# Patient Record
Sex: Male | Born: 1960 | Race: Black or African American | Hispanic: No | Marital: Married | Smoking: Former smoker
Health system: Southern US, Community
[De-identification: ages and names within clinical notes are randomized; demographics above are authoritative.]

## PROBLEM LIST (undated history)

## (undated) ENCOUNTER — Emergency Department (HOSPITAL_COMMUNITY): Admission: EM | Payer: Self-pay | Source: Home / Self Care

## (undated) DIAGNOSIS — G8929 Other chronic pain: Secondary | ICD-10-CM

## (undated) DIAGNOSIS — E1136 Type 2 diabetes mellitus with diabetic cataract: Secondary | ICD-10-CM

## (undated) DIAGNOSIS — M549 Dorsalgia, unspecified: Secondary | ICD-10-CM

## (undated) DIAGNOSIS — N529 Male erectile dysfunction, unspecified: Secondary | ICD-10-CM

## (undated) DIAGNOSIS — E785 Hyperlipidemia, unspecified: Secondary | ICD-10-CM

## (undated) DIAGNOSIS — Z1211 Encounter for screening for malignant neoplasm of colon: Secondary | ICD-10-CM

## (undated) DIAGNOSIS — R918 Other nonspecific abnormal finding of lung field: Secondary | ICD-10-CM

## (undated) HISTORY — DX: Male erectile dysfunction, unspecified: N52.9

## (undated) HISTORY — DX: Hyperlipidemia, unspecified: E78.5

## (undated) HISTORY — DX: Type 2 diabetes mellitus with diabetic cataract: E11.36

## (undated) HISTORY — DX: Other chronic pain: G89.29

## (undated) HISTORY — DX: Dorsalgia, unspecified: M54.9

---

## 1999-03-06 DIAGNOSIS — E118 Type 2 diabetes mellitus with unspecified complications: Secondary | ICD-10-CM | POA: Insufficient documentation

## 2000-02-29 ENCOUNTER — Emergency Department (HOSPITAL_COMMUNITY): Admission: EM | Admit: 2000-02-29 | Discharge: 2000-02-29 | Payer: Self-pay | Admitting: Emergency Medicine

## 2005-03-05 HISTORY — PX: CATARACT EXTRACTION: SUR2

## 2008-04-12 ENCOUNTER — Encounter: Payer: Self-pay | Admitting: Internal Medicine

## 2008-09-20 ENCOUNTER — Encounter: Payer: Self-pay | Admitting: Internal Medicine

## 2008-09-27 ENCOUNTER — Ambulatory Visit: Payer: Self-pay | Admitting: Internal Medicine

## 2008-09-27 ENCOUNTER — Encounter: Payer: Self-pay | Admitting: Internal Medicine

## 2008-09-27 DIAGNOSIS — F528 Other sexual dysfunction not due to a substance or known physiological condition: Secondary | ICD-10-CM | POA: Insufficient documentation

## 2008-09-27 DIAGNOSIS — M549 Dorsalgia, unspecified: Secondary | ICD-10-CM | POA: Insufficient documentation

## 2008-09-27 DIAGNOSIS — E1136 Type 2 diabetes mellitus with diabetic cataract: Secondary | ICD-10-CM

## 2008-09-27 DIAGNOSIS — E785 Hyperlipidemia, unspecified: Secondary | ICD-10-CM

## 2008-09-27 LAB — CONVERTED CEMR LAB
Blood Glucose, Fingerstick: 174
Microalb Creat Ratio: 3.5 mg/g (ref 0.0–30.0)

## 2008-10-11 ENCOUNTER — Encounter (INDEPENDENT_AMBULATORY_CARE_PROVIDER_SITE_OTHER): Payer: Self-pay | Admitting: *Deleted

## 2008-10-11 ENCOUNTER — Ambulatory Visit: Payer: Self-pay | Admitting: Internal Medicine

## 2008-10-11 ENCOUNTER — Encounter: Payer: Self-pay | Admitting: Internal Medicine

## 2008-10-11 LAB — CONVERTED CEMR LAB
AST: 29 units/L (ref 0–37)
Alkaline Phosphatase: 79 units/L (ref 39–117)
Blood Glucose, Fingerstick: 147
Indirect Bilirubin: 0.6 mg/dL (ref 0.0–0.9)
LDL Cholesterol: 193 mg/dL — ABNORMAL HIGH (ref 0–99)
Total Protein: 7.8 g/dL (ref 6.0–8.3)
Triglycerides: 131 mg/dL (ref <150)

## 2008-10-14 ENCOUNTER — Encounter: Payer: Self-pay | Admitting: Internal Medicine

## 2008-10-25 ENCOUNTER — Ambulatory Visit: Payer: Self-pay | Admitting: Internal Medicine

## 2009-01-03 ENCOUNTER — Telehealth: Payer: Self-pay | Admitting: *Deleted

## 2009-01-06 ENCOUNTER — Ambulatory Visit: Payer: Self-pay | Admitting: Infectious Disease

## 2009-01-06 ENCOUNTER — Encounter: Payer: Self-pay | Admitting: Internal Medicine

## 2009-01-06 LAB — CONVERTED CEMR LAB
Blood Glucose, Fingerstick: 114
Hemoglobin, Urine: NEGATIVE
Leukocytes, UA: NEGATIVE
Microalb Creat Ratio: 4 mg/g (ref 0.0–30.0)
Microalb, Ur: 0.5 mg/dL (ref 0.00–1.89)
Nitrite: NEGATIVE
Protein, ur: NEGATIVE mg/dL
pH: 6.5 (ref 5.0–8.0)

## 2009-01-20 ENCOUNTER — Ambulatory Visit: Payer: Self-pay | Admitting: Internal Medicine

## 2009-01-20 LAB — CONVERTED CEMR LAB
AST: 37 units/L (ref 0–37)
Alkaline Phosphatase: 75 units/L (ref 39–117)
Bilirubin, Direct: 0.2 mg/dL (ref 0.0–0.3)
Total Bilirubin: 0.8 mg/dL (ref 0.3–1.2)

## 2009-03-09 ENCOUNTER — Telehealth: Payer: Self-pay | Admitting: Internal Medicine

## 2009-03-30 ENCOUNTER — Encounter: Payer: Self-pay | Admitting: Internal Medicine

## 2009-03-30 LAB — HM DIABETES EYE EXAM

## 2009-04-13 ENCOUNTER — Telehealth: Payer: Self-pay | Admitting: Internal Medicine

## 2009-05-17 ENCOUNTER — Telehealth: Payer: Self-pay | Admitting: *Deleted

## 2009-05-24 ENCOUNTER — Telehealth: Payer: Self-pay | Admitting: *Deleted

## 2009-06-28 ENCOUNTER — Telehealth: Payer: Self-pay | Admitting: Internal Medicine

## 2009-08-02 ENCOUNTER — Telehealth: Payer: Self-pay | Admitting: *Deleted

## 2009-08-29 ENCOUNTER — Telehealth (INDEPENDENT_AMBULATORY_CARE_PROVIDER_SITE_OTHER): Payer: Self-pay | Admitting: *Deleted

## 2009-09-06 ENCOUNTER — Ambulatory Visit: Payer: Self-pay | Admitting: Internal Medicine

## 2009-09-14 ENCOUNTER — Encounter: Payer: Self-pay | Admitting: Internal Medicine

## 2009-10-24 ENCOUNTER — Telehealth: Payer: Self-pay | Admitting: Internal Medicine

## 2009-11-16 ENCOUNTER — Telehealth: Payer: Self-pay | Admitting: Internal Medicine

## 2010-01-24 ENCOUNTER — Telehealth: Payer: Self-pay | Admitting: Internal Medicine

## 2010-02-21 ENCOUNTER — Telehealth: Payer: Self-pay | Admitting: Internal Medicine

## 2010-03-24 ENCOUNTER — Telehealth: Payer: Self-pay | Admitting: *Deleted

## 2010-04-04 NOTE — Assessment & Plan Note (Signed)
Summary: acute-resch from 09/02/2009/cfb   Vital Signs:  Patient profile:   50 year old [place] Height:      66.5 inches (168.91 cm) Weight:      215.6 pounds (98.00 kg) BMI:     34.40 Temp:     97.1 degrees F (36.17 degrees C) oral Pulse rate:   87 / minute BP sitting:   110 / 76  (right arm)  Vitals Entered By: Stanton Kidney Ditzler RN (September 06, 2009 3:52 PM) Is Patient Diabetic? Yes Did you bring your meter with you today? No Pain Assessment Patient in pain? no      Nutritional Status BMI of > 30 = obese Nutritional Status Detail appetite good CBG Result 104  Have you ever been in a relationship where you felt threatened, hurt or afraid?denies   Does patient need assistance? Functional Status Self care Ambulation Normal Comments Refill pain med for back.   History of Present Illness: 50 YO AA [place] recently released from prison (incarcerated from 09/ 2003 to 08/2008) with a PMH of DM HLP, back pain. and is here for F/U. Patient is doing well, and denies any complaints..   His longterm condtions are:  1. DM, type 2 - diagnosed in 2005. On Metformin and Glipizide..  Diet: irregular; high in carbs and fats. Exercises; 2 miles of running/day x 5 days/week. Last Eye exam: 03/2009. OU are with cataracts. OS cataract surgery in 2007. C/O a blurry vision.   2. On last visit Lower back pain, right-sided. Onset 1 year ago. No trauma recalled. "Just one day woke up with it." Described as sharp, intermittent pain, 8/10; worse in morning; notices stifness that gradulally resolves with ROM.Pain is worse with standing, running and in a supine positions and better with sitting down. No radiculopathy. No bladder or bowel incontinence; no fever or chills. we started tramadol 50mg  three times a day. patient today has no pain and would like a refill.   3. Hyperlipidemia, New Diagnosis. tolerates pravastatin well, will recheck FLP and LFT on next visit as patient had a large meal today prior to this  office visit.    Depression History:      The patient denies a depressed mood most of the day and a diminished interest in his usual daily activities.         Preventive Screening-Counseling & Management  Alcohol-Tobacco     Alcohol drinks/day: 2     Alcohol type: beer     Alcohol Counseling: not indicated; use of alcohol is not excessive or problematic     Smoking Status: quit     Packs/Day: 0.25     Year Started: 1998     Year Quit: March 07, 2001  Caffeine-Diet-Exercise     Caffeine use/day: none     Does Patient Exercise: yes     Type of exercise: run, walk     Times/week: every day  Allergies: No Known Drug Allergies  Review of Systems       Per HPI  Physical Exam  General:  alert, well-developed, and cooperative to examination.    Lungs:  normal respiratory effort, no accessory muscle use, normal breath sounds, no crackles, and no wheezes.  Heart:  normal rate, regular rhythm, no murmur, no gallop, and no rub.    Abdomen:  soft, non-tender, normal bowel sounds, no distention, no guarding, no rebound tenderness, no hepatomegaly, and no splenomegaly.    Msk:  no joint swelling, no joint warmth, and no redness  over joints.    Pulses:  2+ DP/PT pulses bilaterally  Extremities:  No cyanosis, clubbing, edema  Neurologic:  alert & oriented X3, cranial nerves II-XII intact, strength normal in all extremities, sensation intact to light touch, and gait normal.       Impression & Recommendations:  Problem # 1:  DM (ICD-250.00) a1c 6.7 today, well controlled, will not make any changes today, patient is uptodate of his vaccinations and eye exams..  will bring back in 3 months for recheck.   His updated medication list for this problem includes:    Metformin Hcl 500 Mg Tabs (Metformin hcl) .Marland Kitchen... Take 1 tablet by mouth once a day    Glipizide Xl 5 Mg Xr24h-tab (Glipizide) .Marland Kitchen... Take 1 tablet by mouth once a day    Aspirin 81 Mg Chew (Aspirin) .Marland Kitchen... Take 1 tablet by mouth  once a day with meals  Orders: T- Capillary Blood Glucose (82948) T-Hgb A1C (in-house) (04540JW)  Problem # 2:  HYPERLIPIDEMIA (ICD-272.4) LDL <100 per last check, on next f/u will check FLP and LFTs. for now will continue pravastatin as patient is tolerating it well.   His updated medication list for this problem includes:    Pravastatin Sodium 40 Mg Tabs (Pravastatin sodium) .Marland Kitchen... Take on tablet at evening.  Labs Reviewed: SGOT: 37 (01/20/2009)   SGPT: 55 (01/20/2009)   HDL:54 (01/20/2009), 58 (10/11/2008)  LDL:98 (01/20/2009), 193 (11/91/4782)  Chol:171 (01/20/2009), 277 (10/11/2008)  Trig:93 (01/20/2009), 131 (10/11/2008)  Problem # 3:  BACK PAIN, CHRONIC (ICD-724.5) well controlled with tramadol, will continue current regiment.   His updated medication list for this problem includes:    Aspirin 81 Mg Chew (Aspirin) .Marland Kitchen... Take 1 tablet by mouth once a day with meals    Ibuprofen 200 Mg Tabs (Ibuprofen) ..... Marland Kitchentake one or two tablets every 6 hours as needed for pain    Tramadol Hcl 50 Mg Tabs (Tramadol hcl) .Marland Kitchen... Take 1-2 tablets, two times per day.  Complete Medication List: 1)  Metformin Hcl 500 Mg Tabs (Metformin hcl) .... Take 1 tablet by mouth once a day 2)  Glipizide Xl 5 Mg Xr24h-tab (Glipizide) .... Take 1 tablet by mouth once a day 3)  Aspirin 81 Mg Chew (Aspirin) .... Take 1 tablet by mouth once a day with meals 4)  Fish Oil 1000 Mg Caps (Omega-3 fatty acids) .... Take 1 capsule by mouth two times a day 5)  Ibuprofen 200 Mg Tabs (Ibuprofen) .... .take one or two tablets every 6 hours as needed for pain 6)  Viagra 100 Mg Tabs (Sildenafil citrate) .... Take one tablet 45 minutes before need. do not take more that one tablet per day. 7)  Tramadol Hcl 50 Mg Tabs (Tramadol hcl) .... Take 1-2 tablets, two times per day. 8)  Freestyle Lite Test Strp (Glucose blood) .... Test blood sugar daily 9)  Lancets Misc (Lancets) .... Use to test blood sugar daily 10)  Pravastatin Sodium  40 Mg Tabs (Pravastatin sodium) .... Take on tablet at evening.  Patient Instructions: 1)  Please schedule a follow-up appointment in 3 months for check of A1c, FLP and LFT's Prescriptions: TRAMADOL HCL 50 MG TABS (TRAMADOL HCL) take 1-2 tablets, two times per day.  #120 x 0   Entered and Authorized by:   Darnelle Maffucci MD   Signed by:   Darnelle Maffucci MD on 09/06/2009   Method used:   Print then Give to Patient   RxID:   [id number]  Prevention & Chronic Care Immunizations   Influenza vaccine: Fluvax 3+  (01/06/2009)   Influenza vaccine deferral: Deferred  (09/06/2009)    Tetanus booster: Not documented   Td booster deferral: Deferred  (01/06/2009)    Pneumococcal vaccine: Not documented  Other Screening   PSA: 2.47  (10/11/2008)   PSA action/deferral: Discussed-PSA requested  (10/11/2008)   Smoking status: quit  (09/06/2009)  Diabetes Mellitus   HgbA1C: 6.7  (09/06/2009)    Eye exam: No diabetic retinopathy.   Cataract.  OD   (03/30/2009)   Diabetic eye exam action/deferral: Ophthalmology referral  (10/11/2008)   Eye exam due: 04/2010    Foot exam: yes  (09/27/2008)   High risk foot: Not documented   Foot care education: Not documented    Urine microalbumin/creatinine ratio: 4.0  (01/06/2009)    Diabetes flowsheet reviewed?: Yes   Progress toward A1C goal: At goal  Lipids   Total Cholesterol: 171  (01/20/2009)   Lipid panel action/deferral: Lipid Panel ordered   LDL: 98  (01/20/2009)   LDL Direct: Not documented   HDL: 54  (01/20/2009)   Triglycerides: 93  (01/20/2009)    SGOT (AST): 37  (01/20/2009)   BMP action: Ordered   SGPT (ALT): 55  (01/20/2009)   Alkaline phosphatase: 75  (01/20/2009)   Total bilirubin: 0.8  (01/20/2009)    Lipid flowsheet reviewed?: Yes   Progress toward LDL goal: At goal  Self-Management Support :   Personal Goals (by the next clinic visit) :     Personal A1C goal: 7  (01/06/2009)     Personal blood pressure goal:  130/80  (01/06/2009)     Personal LDL goal: 100  (09/06/2009)    Patient will work on the following items until the next clinic visit to reach self-care goals:     Medications and monitoring: take my medicines every day, check my blood sugar, bring all of my medications to every visit, examine my feet every day  (09/06/2009)     Eating: drink diet soda or water instead of juice or soda, eat more vegetables, use fresh or frozen vegetables, eat fruit for snacks and desserts  (09/06/2009)     Activity: take a 30 minute walk every day, take the stairs instead of the elevator  (09/06/2009)    Diabetes self-management support: Written self-care plan, Education handout, Resources for patients handout  (09/06/2009)   Diabetes care plan printed   Diabetes education handout printed   Last diabetes self-management training by diabetes educator: 10/25/2008    Lipid self-management support: Written self-care plan, Education handout, Resources for patients handout  (09/06/2009)   Lipid self-care plan printed.   Lipid education handout printed      Resource handout printed.  Laboratory Results   Blood Tests   Date/Time Received: September 06, 2009 4:05 PM  Date/Time Reported: Alric Quan  September 06, 2009 4:05 PM   HGBA1C: 6.7%   (Normal Range: Non-Diabetic - 3-6%   Control Diabetic - 6-8%) CBG Random:: 104mg /dL

## 2010-04-04 NOTE — Progress Notes (Signed)
Summary: refill/ hla  Phone Note Refill Request Message from:  Pharmacy on May 24, 2009 4:53 PM  Refills Requested: Medication #1:  METFORMIN HCL 500 MG TABS Take 1 tablet by mouth once a day   Last Refilled: 04/13/2009 Initial call taken by: Marin Roberts RN,  May 24, 2009 4:53 PM  Follow-up for Phone Call        By accident I printed script then I threw it away, and resent again via fax. Follow-up by: Darnelle Maffucci MD,  May 24, 2009 8:11 PM  Additional Follow-up for Phone Call Additional follow up Details #1::        Rx called to pharmacy Additional Follow-up by: Marin Roberts RN,  May 31, 2009 4:55 PM    Prescriptions: METFORMIN HCL 500 MG TABS (METFORMIN HCL) Take 1 tablet by mouth once a day  #30 x 5   Entered and Authorized by:   Darnelle Maffucci MD   Signed by:   Darnelle Maffucci MD on 05/24/2009   Method used:   Faxed to ...       [facility] Department (retail)       [address] [place], [place]  [address]       Ph: [phone]       Fax: [phone]   RxID:   [id number] METFORMIN HCL 500 MG TABS (METFORMIN HCL) Take 1 tablet by mouth once a day  #30 x 5   Entered and Authorized by:   Darnelle Maffucci MD   Signed by:   Darnelle Maffucci MD on 05/24/2009   Method used:   Print then Give to Patient   RxID:   [id number]

## 2010-04-04 NOTE — Progress Notes (Signed)
Summary: refill/gg    Phone Note Refill Request  on August 29, 2009 3:47 PM  Refills Requested: Medication #1:  TRAMADOL HCL 50 MG TABS take 1-2 tablets   Last Refilled: 08/03/2009  Method Requested: Electronic Initial call taken by: Merrie Roof RN,  August 29, 2009 3:48 PM  Follow-up for Phone Call        Pt. needs an OV. He has not been seen in 11 months and has been getting 120 of these a month for the last 6-7 months. He should not need any of these until 09/02/09. Follow-up by: Zoila Shutter MD,  August 29, 2009 4:06 PM  Additional Follow-up for Phone Call Additional follow up Details #1::        Pt has appointment July 1 refills at that time Additional Follow-up by: Merrie Roof RN,  September 02, 2009 1:47 PM

## 2010-04-04 NOTE — Progress Notes (Signed)
Summary: refill/ hla  Phone Note Refill Request Message from:  Fax from Pharmacy on May 17, 2009 2:44 PM  Refills Requested: Medication #1:  TRAMADOL HCL 50 MG TABS take 1-2 tablets   Last Refilled: 2/9 last visit 11/18, never had cmet  Initial call taken by: Marin Roberts RN,  May 17, 2009 2:46 PM  Follow-up for Phone Call       Follow-up by: Darnelle Maffucci MD,  May 17, 2009 8:19 PM    Prescriptions: TRAMADOL HCL 50 MG TABS (TRAMADOL HCL) take 1-2 tablets, two times per day.  #120 x 0   Entered and Authorized by:   Darnelle Maffucci MD   Signed by:   Darnelle Maffucci MD on 05/17/2009   Method used:   Electronically to        [facility]  [address] [id number]* (retail)       [address]       [place], [place]  [address]       Ph: [phone] or [phone]       Fax: [phone]   RxID:   [id number]

## 2010-04-04 NOTE — Progress Notes (Signed)
Summary: REFILL/ HLA  Phone Note Refill Request Message from:  Fax from Pharmacy on April 13, 2009 12:11 PM  Refills Requested: Medication #1:  TRAMADOL HCL 50 MG TABS take 1-2 tablets   Dosage confirmed as above?Dosage Confirmed   Last Refilled: 1/5 Initial call taken by: Marin Roberts RN,  April 13, 2009 12:11 PM    Prescriptions: TRAMADOL HCL 50 MG TABS (TRAMADOL HCL) take 1-2 tablets, two times per day.  #120 x 0   Entered and Authorized by:   Darnelle Maffucci MD   Signed by:   Darnelle Maffucci MD on 04/13/2009   Method used:   Faxed to ...       [facility] Department (retail)       [address] [place], [place]  [address]       Ph: [phone]       Fax: [phone]   RxID:   [id number]

## 2010-04-04 NOTE — Progress Notes (Signed)
Summary: Refill/gh  Phone Note Refill Request Message from:  Fax from Pharmacy on Aug 02, 2009 4:05 PM  Refills Requested: Medication #1:  TRAMADOL HCL 50 MG TABS take 1-2 tablets   Last Refilled: 06/30/2009  Method Requested: Fax to Local Pharmacy Initial call taken by: Angelina Ok RN,  Aug 02, 2009 4:06 PM  Follow-up for Phone Call       Follow-up by: Darnelle Maffucci MD,  Aug 02, 2009 8:38 PM  Additional Follow-up for Phone Call Additional follow up Details #1::        Rx called to pharmacy Additional Follow-up by: Marin Roberts RN,  August 03, 2009 2:04 PM    Prescriptions: TRAMADOL HCL 50 MG TABS (TRAMADOL HCL) take 1-2 tablets, two times per day.  #120 x 0   Entered and Authorized by:   Darnelle Maffucci MD   Signed by:   Darnelle Maffucci MD on 08/02/2009   Method used:   Printed then faxed to ...       [facility] Department (retail)       [address] [place], [place]  [address]       Ph: [phone]       Fax: [phone]   RxID:   [id number]

## 2010-04-04 NOTE — Progress Notes (Signed)
Summary: Refill/gh  Phone Note Refill Request Message from:  Fax from Pharmacy on March 09, 2009 10:16 AM  Refills Requested: Medication #1:  TRAMADOL HCL 50 MG TABS take 1-2 tablets   Last Refilled: 02/08/2009  Method Requested: Fax to Local Pharmacy Initial call taken by: Angelina Ok RN,  March 09, 2009 10:17 AM  Follow-up for Phone Call        Rx written and printed off.  Tramadol for LBP, max of three per day per old records but has been receiving 120 per refill.  Will refill for one month at #120.   Follow-up by: Blanch Media MD,  March 09, 2009 10:47 AM    Prescriptions: TRAMADOL HCL 50 MG TABS (TRAMADOL HCL) take 1-2 tablets, two times per day.  #120 x 0   Entered and Authorized by:   Blanch Media MD   Signed by:   Blanch Media MD on 03/09/2009   Method used:   Print then Give to Patient   RxID:   [id number]   Appended Document: Refill/gh Prescription refill for Tramadol called to the [facility].  Lynden Oxford, RN March 09, 2009 11:10 AM

## 2010-04-04 NOTE — Progress Notes (Signed)
Summary: refill/ hla  Phone Note Refill Request Message from:  Fax from Pharmacy on January 24, 2010 3:54 PM  Refills Requested: Medication #1:  TRAMADOL HCL 50 MG TABS take 1-2 tablets   Dosage confirmed as above?Dosage Confirmed   Last Refilled: 9/15 Initial call taken by: Marin Roberts RN,  January 24, 2010 3:54 PM  Follow-up for Phone Call       Follow-up by: Darnelle Maffucci MD,  January 25, 2010 4:59 PM    Prescriptions: TRAMADOL HCL 50 MG TABS (TRAMADOL HCL) take 1-2 tablets, two times per day.  #120 x 0   Entered and Authorized by:   Darnelle Maffucci MD   Signed by:   Darnelle Maffucci MD on 01/25/2010   Method used:   Faxed to ...       [facility] (retail)             [place], [place]         Ph:        Fax: [phone]   RxID:   [id number] TRAMADOL HCL 50 MG TABS (TRAMADOL HCL) take 1-2 tablets, two times per day.  #120 x 0   Entered and Authorized by:   Darnelle Maffucci MD   Signed by:   Darnelle Maffucci MD on 01/25/2010   Method used:   Print then Give to Patient   RxID:   [id number]

## 2010-04-04 NOTE — Progress Notes (Signed)
Summary: Refill/gh  Phone Note Refill Request Message from:  Fax from Pharmacy on October 24, 2009 1:50 PM  Refills Requested: Medication #1:  TRAMADOL HCL 50 MG TABS take 1-2 tablets   Last Refilled: 09/14/2009  Method Requested: Electronic Initial call taken by: Angelina Ok RN,  October 24, 2009 1:50 PM  Follow-up for Phone Call        script was faxed. thanks. Follow-up by: Darnelle Maffucci MD,  October 24, 2009 2:23 PM  Additional Follow-up for Phone Call Additional follow up Details #1::        Rx faxed to pharmacy Additional Follow-up by: Angelina Ok RN,  October 25, 2009 3:37 PM    Prescriptions: TRAMADOL HCL 50 MG TABS (TRAMADOL HCL) take 1-2 tablets, two times per day.  #120 x 0   Entered and Authorized by:   Darnelle Maffucci MD   Signed by:   Darnelle Maffucci MD on 10/24/2009   Method used:   Faxed to ...       [facility] Department (retail)       [address] [place], [place]  [address]       Ph: [phone]       Fax: [phone]   RxID:   [id number]

## 2010-04-04 NOTE — Progress Notes (Signed)
Summary: refill/gg  Phone Note Refill Request  on June 28, 2009 10:06 AM  Refills Requested: Medication #1:  GLIPIZIDE XL 5 MG XR24H-TAB Take 1 tablet by mouth once a day   Last Refilled: 05/27/2009  Medication #2:  TRAMADOL HCL 50 MG TABS take 1-2 tablets   Last Refilled: 05/27/2009  Method Requested: Fax to Local Pharmacy Initial call taken by: Merrie Roof RN,  June 28, 2009 10:06 AM  Follow-up for Phone Call        Refill approved-nurse to complete Follow-up by: Darnelle Maffucci MD,  June 29, 2009 9:43 PM  Additional Follow-up for Phone Call Additional follow up Details #1::        Rx faxed to pharmacy Additional Follow-up by: Merrie Roof RN,  June 30, 2009 8:57 AM    Prescriptions: TRAMADOL HCL 50 MG TABS (TRAMADOL HCL) take 1-2 tablets, two times per day.  #120 x 0   Entered and Authorized by:   Darnelle Maffucci MD   Signed by:   Darnelle Maffucci MD on 06/29/2009   Method used:   Telephoned to ...       [facility] Department (retail)       [address] [place], [place]  [address]       Ph: [phone]       Fax: [phone]   RxID:   [id number] GLIPIZIDE XL 5 MG XR24H-TAB (GLIPIZIDE) Take 1 tablet by mouth once a day  #30 x 5   Entered and Authorized by:   Darnelle Maffucci MD   Signed by:   Darnelle Maffucci MD on 06/29/2009   Method used:   Telephoned to ...       [facility] Department (retail)       [address] [place], [place]  [address]       Ph: [phone]       Fax: [phone]   RxID:   [id number]

## 2010-04-04 NOTE — Progress Notes (Signed)
Summary: med refill/gp  Phone Note Refill Request Message from:  Fax from Pharmacy on November 16, 2009 3:25 PM  Refills Requested: Medication #1:  TRAMADOL HCL 50 MG TABS take 1-2 tablets   Last Refilled: 10/24/2009 Last appt. 09/06/09.   Method Requested: Telephone to Pharmacy Initial call taken by: Chinita Pester RN,  November 16, 2009 3:25 PM  Follow-up for Phone Call        Rx refill request faxed to [facility] pharmacy. Follow-up by: Chinita Pester RN,  November 18, 2009 10:00 AM    Prescriptions: TRAMADOL HCL 50 MG TABS (TRAMADOL HCL) take 1-2 tablets, two times per day.  #120 x 0   Entered and Authorized by:   Darnelle Maffucci MD   Signed by:   Darnelle Maffucci MD on 11/16/2009   Method used:   Faxed to ...       [facility] Department (retail)       [address] [place], [place]  [address]       Ph: [phone]       Fax: [phone]   RxID:   [id number]

## 2010-04-04 NOTE — Consult Note (Signed)
Summary: [employer] 2010: Diabetic Eye Exam  [employer] 2010: Diabetic Eye Exam   Imported By: Florinda Marker 05/25/2009 16:50:03  _____________________________________________________________________  External Attachment:    Type:   Image     Comment:   External Document  Appended Document: [employer] 2010: Diabetic Eye Exam   Diabetic Eye Exam  Procedure date:  03/30/2009  Findings:      No diabetic retinopathy.   Cataract.  OD   Procedures Next Due Date:    Diabetic Eye Exam: 04/2010   Diabetic Eye Exam  Procedure date:  03/30/2009  Findings:      No diabetic retinopathy.   Cataract.  OD   Procedures Next Due Date:    Diabetic Eye Exam: 04/2010

## 2010-04-04 NOTE — Medication Information (Signed)
Summary: Medication Assistance: RX  Medication Assistance: RX   Imported By: Shon Hough 09/23/2009 15:04:55  _____________________________________________________________________  External Attachment:    Type:   Image     Comment:   External Document

## 2010-04-06 NOTE — Progress Notes (Signed)
Summary: refill/ hla  Phone Note Refill Request Message from:  Fax from Pharmacy on March 24, 2010 6:24 PM  Refills Requested: Medication #1:  TRAMADOL HCL 50 MG TABS take 1-2 tablets   Dosage confirmed as above?Dosage Confirmed   Last Refilled: 12/23 Initial call taken by: Marin Roberts RN,  March 24, 2010 6:24 PM  Additional Follow-up for Phone Call Additional follow up Details #1::        Rx called to pharmacy Additional Follow-up by: Marin Roberts RN,  March 30, 2010 3:02 PM    Prescriptions: TRAMADOL HCL 50 MG TABS (TRAMADOL HCL) take 1-2 tablets, two times per day.  #120 x 0   Entered and Authorized by:   Darnelle Maffucci MD   Signed by:   Darnelle Maffucci MD on 03/25/2010   Method used:   Faxed to ...       [facility] Department (retail)       [address] [place], [place]  [address]       Ph: [phone]       Fax: [phone]   RxID:   [id number]

## 2010-04-06 NOTE — Progress Notes (Signed)
Summary: med refill/gp  Phone Note Refill Request Message from:  Fax from Pharmacy on February 21, 2010 10:24 AM  Refills Requested: Medication #1:  TRAMADOL HCL 50 MG TABS take 1-2 tablets   Last Refilled: 01/30/2010 Last appt, 09/06/09; next appt. 04/10/10.   Method Requested: Fax to Local Pharmacy Initial call taken by: Chinita Pester RN,  February 21, 2010 10:24 AM  Follow-up for Phone Call        Rx faxed to pharmacy. Follow-up by: Margarito Liner MD,  February 21, 2010 10:45 AM    Prescriptions: TRAMADOL HCL 50 MG TABS (TRAMADOL HCL) take 1-2 tablets, two times per day.  #120 x 0   Entered and Authorized by:   Margarito Liner MD   Signed by:   Margarito Liner MD on 02/21/2010   Method used:   Faxed to ...       [facility] Department (retail)       [address] [place], [place]  [address]       Ph: [phone]       Fax: [phone]   RxID:   [id number]   Appended Document: med refill/gp Tramadol Rx refill request faxed to [facility] pharmacy.

## 2010-04-10 ENCOUNTER — Encounter: Payer: Self-pay | Admitting: Internal Medicine

## 2010-04-18 ENCOUNTER — Other Ambulatory Visit: Payer: Self-pay | Admitting: *Deleted

## 2010-04-20 MED ORDER — SILDENAFIL CITRATE 100 MG PO TABS: 100.0000 mg | ORAL_TABLET | ORAL | Status: DC | PRN

## 2010-04-28 ENCOUNTER — Other Ambulatory Visit: Payer: Self-pay | Admitting: *Deleted

## 2010-04-30 MED ORDER — TRAMADOL HCL 50 MG PO TABS: 50.0000 mg | ORAL_TABLET | Freq: Two times a day (BID) | ORAL | Status: DC

## 2010-06-01 ENCOUNTER — Encounter: Payer: Self-pay | Admitting: Internal Medicine

## 2010-06-06 ENCOUNTER — Encounter: Payer: Self-pay | Admitting: Internal Medicine

## 2010-06-10 LAB — GLUCOSE, CAPILLARY: Glucose-Capillary: 147 mg/dL — ABNORMAL HIGH (ref 70–99)

## 2010-06-11 LAB — GLUCOSE, CAPILLARY: Glucose-Capillary: 174 mg/dL — ABNORMAL HIGH (ref 70–99)

## 2010-06-27 ENCOUNTER — Ambulatory Visit (INDEPENDENT_AMBULATORY_CARE_PROVIDER_SITE_OTHER): Payer: Self-pay | Admitting: Internal Medicine

## 2010-06-27 ENCOUNTER — Encounter: Payer: Self-pay | Admitting: Internal Medicine

## 2010-06-27 VITALS — BP 114/73 | HR 60 | Temp 97.0°F | Ht 66.5 in | Wt 215.0 lb

## 2010-06-27 DIAGNOSIS — M549 Dorsalgia, unspecified: Secondary | ICD-10-CM

## 2010-06-27 DIAGNOSIS — Z0000 Encounter for general adult medical examination without abnormal findings: Secondary | ICD-10-CM

## 2010-06-27 DIAGNOSIS — E785 Hyperlipidemia, unspecified: Secondary | ICD-10-CM

## 2010-06-27 DIAGNOSIS — E119 Type 2 diabetes mellitus without complications: Secondary | ICD-10-CM

## 2010-06-27 DIAGNOSIS — Z23 Encounter for immunization: Secondary | ICD-10-CM

## 2010-06-27 LAB — COMPREHENSIVE METABOLIC PANEL
Albumin: 5 g/dL (ref 3.5–5.2)
BUN: 21 mg/dL (ref 6–23)
CO2: 25 mEq/L (ref 19–32)
Calcium: 10 mg/dL (ref 8.4–10.5)
Chloride: 103 mEq/L (ref 96–112)
Glucose, Bld: 123 mg/dL — ABNORMAL HIGH (ref 70–99)
Potassium: 4.3 mEq/L (ref 3.5–5.3)
Sodium: 138 mEq/L (ref 135–145)
Total Protein: 7.8 g/dL (ref 6.0–8.3)

## 2010-06-27 LAB — LIPID PANEL
Cholesterol: 266 mg/dL — ABNORMAL HIGH (ref 0–200)
Total CHOL/HDL Ratio: 5 Ratio

## 2010-06-27 LAB — POCT GLYCOSYLATED HEMOGLOBIN (HGB A1C): Hemoglobin A1C: 6.6

## 2010-06-27 MED ORDER — TRAMADOL HCL 50 MG PO TABS: 50.0000 mg | ORAL_TABLET | Freq: Two times a day (BID) | ORAL | Status: DC

## 2010-06-27 NOTE — Assessment & Plan Note (Signed)
Today's patient's A1c 6.6, no need to make any changes to his regiment, we'll refer her to an ophthalmologist for eye exam.

## 2010-06-27 NOTE — Progress Notes (Signed)
  Subjective:    Patient ID: Jacob Durham, male    DOB: Apr 28, 1960, 50 y.o.   MRN: [id number]  HPI  Patient is a 50 year old male with a past medical history of diabetes mellitus type 2, lower back pain and hyperlipidemia. Since the outpatient clinic for routine followup. She denies any new complaints. Reports that his back pain is excellently controlled with tramadol. He is compliant with all his medications. Today request refills. I have also discussed the patient's routine health maintenance examinations including colonoscopy, eye exam, Td, and pneumonia vaccine. he agrees to all the above.   Review of Systems  [all other systems reviewed and are negative       Objective:   Physical Exam  [nursing notereviewed. Constitutional: He is oriented to person, place, and time. He appears well-developed and well-nourished.  HENT:  Head: Normocephalic and atraumatic.  Eyes: Pupils are equal, round, and reactive to light.  Neck: Normal range of motion. No JVD present. No thyromegaly present.  Cardiovascular: Normal rate, regular rhythm and normal heart sounds.   Pulmonary/Chest: Effort normal and breath sounds normal. He has no wheezes. He has no rales.  Abdominal: Soft. Bowel sounds are normal. There is no tenderness. There is no rebound.  Musculoskeletal: Normal range of motion. He exhibits no edema.  Neurological: He is alert and oriented to person, place, and time.  Skin: Skin is warm and dry.          Assessment & Plan:

## 2010-06-27 NOTE — Assessment & Plan Note (Signed)
Well-controlled on tramadol, continue current treatment.

## 2010-06-27 NOTE — Assessment & Plan Note (Signed)
We'll recheck fasting lipid,

## 2010-06-28 ENCOUNTER — Telehealth: Payer: Self-pay | Admitting: Internal Medicine

## 2010-06-28 ENCOUNTER — Other Ambulatory Visit: Payer: Self-pay | Admitting: Internal Medicine

## 2010-06-28 DIAGNOSIS — E785 Hyperlipidemia, unspecified: Secondary | ICD-10-CM

## 2010-06-28 LAB — MICROALBUMIN / CREATININE URINE RATIO
Creatinine, Urine: 255.5 mg/dL
Microalb Creat Ratio: 2.7 mg/g (ref 0.0–30.0)
Microalb, Ur: 0.68 mg/dL (ref 0.00–1.89)

## 2010-06-28 MED ORDER — SIMVASTATIN 40 MG PO TABS: 40.0000 mg | ORAL_TABLET | Freq: Every day | ORAL | Status: AC

## 2010-06-28 NOTE — Telephone Encounter (Signed)
His LDL is elevated, will change his pravastatin to simvastatin for better control of his LDL. He is informed and voices agreement. Prescription called into the pharmacy for patient.

## 2010-07-13 ENCOUNTER — Telehealth: Payer: Self-pay | Admitting: *Deleted

## 2010-07-13 NOTE — Telephone Encounter (Signed)
CALLED PATIENT AND LEFT VOICE MESSAGE FOR MR Jacob Durham TO GIVE ME A CALL SO I CAN GET HIM SCHEDULED FOR [facility]. HE IS TO CALL ME AT [phone]

## 2010-08-28 ENCOUNTER — Emergency Department (HOSPITAL_COMMUNITY): Payer: Self-pay

## 2010-08-28 ENCOUNTER — Inpatient Hospital Stay (HOSPITAL_COMMUNITY): Payer: No Typology Code available for payment source

## 2010-08-28 ENCOUNTER — Inpatient Hospital Stay (HOSPITAL_COMMUNITY)
Admission: EM | Admit: 2010-08-28 | Discharge: 2010-09-03 | DRG: 956 | Disposition: A | Discharge status: Home / Self Care | Payer: No Typology Code available for payment source | Source: Ambulatory Visit | Attending: General Surgery | Admitting: General Surgery

## 2010-08-28 DIAGNOSIS — S2220XA Unspecified fracture of sternum, initial encounter for closed fracture: Secondary | ICD-10-CM

## 2010-08-28 DIAGNOSIS — S060X9A Concussion with loss of consciousness of unspecified duration, initial encounter: Secondary | ICD-10-CM

## 2010-08-28 DIAGNOSIS — E119 Type 2 diabetes mellitus without complications: Secondary | ICD-10-CM

## 2010-08-28 DIAGNOSIS — R109 Unspecified abdominal pain: Secondary | ICD-10-CM | POA: Diagnosis present

## 2010-08-28 DIAGNOSIS — S301XXA Contusion of abdominal wall, initial encounter: Secondary | ICD-10-CM | POA: Diagnosis present

## 2010-08-28 DIAGNOSIS — S27329A Contusion of lung, unspecified, initial encounter: Secondary | ICD-10-CM | POA: Diagnosis present

## 2010-08-28 DIAGNOSIS — M25539 Pain in unspecified wrist: Secondary | ICD-10-CM | POA: Diagnosis present

## 2010-08-28 DIAGNOSIS — Y998 Other external cause status: Secondary | ICD-10-CM

## 2010-08-28 DIAGNOSIS — S7290XA Unspecified fracture of unspecified femur, initial encounter for closed fracture: Secondary | ICD-10-CM

## 2010-08-28 DIAGNOSIS — IMO0002 Reserved for concepts with insufficient information to code with codable children: Secondary | ICD-10-CM | POA: Diagnosis present

## 2010-08-28 DIAGNOSIS — R55 Syncope and collapse: Secondary | ICD-10-CM | POA: Diagnosis not present

## 2010-08-28 DIAGNOSIS — S72309B Unspecified fracture of shaft of unspecified femur, initial encounter for open fracture type I or II: Principal | ICD-10-CM | POA: Diagnosis present

## 2010-08-28 DIAGNOSIS — R51 Headache: Secondary | ICD-10-CM | POA: Diagnosis present

## 2010-08-28 DIAGNOSIS — Y92410 Unspecified street and highway as the place of occurrence of the external cause: Secondary | ICD-10-CM

## 2010-08-28 DIAGNOSIS — S01502A Unspecified open wound of oral cavity, initial encounter: Secondary | ICD-10-CM | POA: Diagnosis present

## 2010-08-28 DIAGNOSIS — D62 Acute posthemorrhagic anemia: Secondary | ICD-10-CM | POA: Diagnosis not present

## 2010-08-28 DIAGNOSIS — F1010 Alcohol abuse, uncomplicated: Secondary | ICD-10-CM | POA: Diagnosis present

## 2010-08-28 DIAGNOSIS — S060XAA Concussion with loss of consciousness status unknown, initial encounter: Secondary | ICD-10-CM | POA: Diagnosis present

## 2010-08-28 LAB — URINE DRUGS OF ABUSE SCREEN W ALC, ROUTINE (REF LAB)
Barbiturate Quant, Ur: NEGATIVE
Creatinine,U: 90.5 mg/dL
Methadone: NEGATIVE
Phencyclidine (PCP): NEGATIVE

## 2010-08-28 LAB — POCT I-STAT, CHEM 8
Chloride: 104 meq/L (ref 96–112)
Glucose, Bld: 157 mg/dL — ABNORMAL HIGH (ref 70–99)
HCT: 43 % (ref 39.0–52.0)
Hemoglobin: 14.6 g/dL (ref 13.0–17.0)
Potassium: 3.7 meq/L (ref 3.5–5.1)
Sodium: 140 meq/L (ref 135–145)

## 2010-08-28 LAB — GLUCOSE, CAPILLARY
Glucose-Capillary: 116 mg/dL — ABNORMAL HIGH (ref 70–99)
Glucose-Capillary: 138 mg/dL — ABNORMAL HIGH (ref 70–99)
Glucose-Capillary: 184 mg/dL — ABNORMAL HIGH (ref 70–99)

## 2010-08-28 LAB — MRSA PCR SCREENING: MRSA by PCR: NEGATIVE

## 2010-08-28 LAB — PROTIME-INR
INR: 1.08 (ref 0.00–1.49)
Prothrombin Time: 14.2 seconds (ref 11.6–15.2)

## 2010-08-28 LAB — COMPREHENSIVE METABOLIC PANEL
Calcium: 9 mg/dL (ref 8.4–10.5)
GFR calc non Af Amer: >60 mL/min (ref >60)
Potassium: 3.7 mEq/L (ref 3.5–5.1)
Sodium: 139 mEq/L (ref 135–145)
Total Bilirubin: 0.7 mg/dL (ref 0.3–1.2)
Total Protein: 7.4 g/dL (ref 6.0–8.3)

## 2010-08-28 LAB — CBC
HCT: 40.5 % (ref 39.0–52.0)
Hemoglobin: 14 g/dL (ref 13.0–17.0)
RBC: 4.51 MIL/uL (ref 4.22–5.81)

## 2010-08-28 LAB — HEMOGLOBIN A1C: Mean Plasma Glucose: 157 mg/dL — ABNORMAL HIGH (ref <117)

## 2010-08-28 LAB — ABO/RH: ABO/RH(D): O POS

## 2010-08-28 LAB — ETHANOL: Alcohol, Ethyl (B): 132 mg/dL — ABNORMAL HIGH (ref 0–11)

## 2010-08-28 LAB — LACTIC ACID, PLASMA: Lactic Acid, Venous: 4.2 mmol/L — ABNORMAL HIGH (ref 0.5–2.2)

## 2010-08-28 MED ORDER — IOHEXOL 300 MG/ML  SOLN
100.0000 mL | Freq: Once | INTRAMUSCULAR | Status: AC | PRN
  Administered 2010-08-28: 100 mL via INTRAVENOUS

## 2010-08-29 ENCOUNTER — Inpatient Hospital Stay (HOSPITAL_COMMUNITY): Payer: No Typology Code available for payment source

## 2010-08-29 LAB — GLUCOSE, CAPILLARY
Glucose-Capillary: 173 mg/dL — ABNORMAL HIGH (ref 70–99)
Glucose-Capillary: 169 mg/dL — ABNORMAL HIGH (ref 70–99)

## 2010-08-29 LAB — CBC
HCT: 25.7 % — ABNORMAL LOW (ref 39.0–52.0)
MCH: 30.4 pg (ref 26.0–34.0)
MCHC: 34.2 g/dL (ref 30.0–36.0)
MCV: 88.9 fL (ref 78.0–100.0)
RDW: 12.9 % (ref 11.5–15.5)

## 2010-08-30 LAB — CBC
MCHC: 35.3 g/dL (ref 30.0–36.0)
Platelets: 160 10*3/uL (ref 150–400)
RDW: 13 % (ref 11.5–15.5)
WBC: 5.7 10*3/uL (ref 4.0–10.5)

## 2010-08-30 LAB — GLUCOSE, CAPILLARY
Glucose-Capillary: 213 mg/dL — ABNORMAL HIGH (ref 70–99)
Glucose-Capillary: 205 mg/dL — ABNORMAL HIGH (ref 70–99)
Glucose-Capillary: 122 mg/dL — ABNORMAL HIGH (ref 70–99)

## 2010-08-31 LAB — PREPARE RBC (CROSSMATCH)

## 2010-08-31 LAB — CBC
MCHC: 33.8 g/dL (ref 30.0–36.0)
MCV: 90 fL (ref 78.0–100.0)
Platelets: 170 10*3/uL (ref 150–400)
RDW: 12.9 % (ref 11.5–15.5)
WBC: 6.1 10*3/uL (ref 4.0–10.5)

## 2010-08-31 LAB — GLUCOSE, CAPILLARY
Glucose-Capillary: 97 mg/dL (ref 70–99)
Glucose-Capillary: 130 mg/dL — ABNORMAL HIGH (ref 70–99)

## 2010-09-01 LAB — TYPE AND SCREEN
Antibody Screen: NEGATIVE
Unit division: 0
Unit division: 0

## 2010-09-01 LAB — GLUCOSE, CAPILLARY
Glucose-Capillary: 139 mg/dL — ABNORMAL HIGH (ref 70–99)
Glucose-Capillary: 124 mg/dL — ABNORMAL HIGH (ref 70–99)
Glucose-Capillary: 134 mg/dL — ABNORMAL HIGH (ref 70–99)

## 2010-09-01 LAB — BASIC METABOLIC PANEL
Chloride: 99 mEq/L (ref 96–112)
GFR calc Af Amer: >60 mL/min (ref >60)
GFR calc non Af Amer: >60 mL/min (ref >60)
Potassium: 3.9 mEq/L (ref 3.5–5.1)
Sodium: 134 mEq/L — ABNORMAL LOW (ref 135–145)

## 2010-09-01 LAB — CBC
Platelets: 203 10*3/uL (ref 150–400)
RBC: 2.77 MIL/uL — ABNORMAL LOW (ref 4.22–5.81)
RDW: 13.3 % (ref 11.5–15.5)
WBC: 6.4 10*3/uL (ref 4.0–10.5)

## 2010-09-02 DIAGNOSIS — S2691XA Contusion of heart, unspecified with or without hemopericardium, initial encounter: Secondary | ICD-10-CM

## 2010-09-02 LAB — GLUCOSE, CAPILLARY
Glucose-Capillary: 101 mg/dL — ABNORMAL HIGH (ref 70–99)
Glucose-Capillary: 132 mg/dL — ABNORMAL HIGH (ref 70–99)

## 2010-09-03 LAB — CBC
Hemoglobin: 8.7 g/dL — ABNORMAL LOW (ref 13.0–17.0)
MCHC: 33.3 g/dL (ref 30.0–36.0)
Platelets: 279 10*3/uL (ref 150–400)
RBC: 2.81 MIL/uL — ABNORMAL LOW (ref 4.22–5.81)

## 2010-09-03 LAB — GLUCOSE, CAPILLARY
Glucose-Capillary: 142 mg/dL — ABNORMAL HIGH (ref 70–99)
Glucose-Capillary: 152 mg/dL — ABNORMAL HIGH (ref 70–99)

## 2010-09-04 ENCOUNTER — Other Ambulatory Visit: Payer: Self-pay | Admitting: *Deleted

## 2010-09-04 MED ORDER — GLIPIZIDE ER 5 MG PO TB24: 5.0000 mg | ORAL_TABLET | Freq: Every day | ORAL | Status: DC
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
## 2010-10-03 ENCOUNTER — Ambulatory Visit: Payer: No Typology Code available for payment source | Admitting: Physical Therapy

## 2010-10-26 ENCOUNTER — Other Ambulatory Visit: Payer: Self-pay | Admitting: *Deleted

## 2010-10-26 DIAGNOSIS — M549 Dorsalgia, unspecified: Secondary | ICD-10-CM

## 2010-10-26 MED ORDER — TRAMADOL HCL 50 MG PO TABS: 50.0000 mg | ORAL_TABLET | Freq: Two times a day (BID) | ORAL | Status: DC

## 2010-10-26 NOTE — Telephone Encounter (Signed)
Refill faxed to the [facility].

## 2011-03-15 ENCOUNTER — Other Ambulatory Visit: Payer: Self-pay | Admitting: *Deleted

## 2011-03-15 DIAGNOSIS — M549 Dorsalgia, unspecified: Secondary | ICD-10-CM

## 2011-03-16 MED ORDER — TRAMADOL HCL 50 MG PO TABS: 50.0000 mg | ORAL_TABLET | Freq: Two times a day (BID) | ORAL | Status: DC

## 2011-03-16 MED ORDER — METFORMIN HCL 500 MG PO TABS: 500.0000 mg | ORAL_TABLET | Freq: Every day | ORAL | Status: DC

## 2011-03-16 NOTE — Telephone Encounter (Signed)
Metformin and Tramadol rxs called to [facility] pharmacy.

## 2011-04-24 ENCOUNTER — Other Ambulatory Visit: Payer: Self-pay | Admitting: Internal Medicine

## 2011-05-15 ENCOUNTER — Encounter: Payer: Self-pay | Admitting: Dietician

## 2011-06-23 ENCOUNTER — Other Ambulatory Visit: Payer: Self-pay | Admitting: Internal Medicine

## 2011-08-04 ENCOUNTER — Other Ambulatory Visit: Payer: Self-pay | Admitting: Internal Medicine

## 2011-08-29 ENCOUNTER — Other Ambulatory Visit: Payer: Self-pay | Admitting: *Deleted

## 2011-08-29 NOTE — Telephone Encounter (Signed)
Message sent to front dest to make appointment 

## 2011-09-05 ENCOUNTER — Other Ambulatory Visit: Payer: Self-pay | Admitting: *Deleted

## 2011-09-05 MED ORDER — TRAMADOL HCL 50 MG PO TABS: 50.0000 mg | ORAL_TABLET | Freq: Two times a day (BID) | ORAL | Status: DC | PRN

## 2011-09-18 ENCOUNTER — Encounter: Payer: No Typology Code available for payment source | Admitting: Internal Medicine

## 2011-10-11 ENCOUNTER — Other Ambulatory Visit: Payer: Self-pay | Admitting: Internal Medicine

## 2011-10-11 NOTE — Telephone Encounter (Signed)
Pt last seen 06/2010. He has DM, HTN, and chronic pain - all of which need monitoring. Dr Gilford Rile had been refilling all meds despite not having seen pt. He no showed his 09/18/11 appt. Pt needs to make and keep appt.

## 2011-11-06 ENCOUNTER — Encounter: Payer: Self-pay | Admitting: Internal Medicine

## 2011-11-06 ENCOUNTER — Ambulatory Visit (INDEPENDENT_AMBULATORY_CARE_PROVIDER_SITE_OTHER): Payer: Self-pay | Admitting: Internal Medicine

## 2011-11-06 VITALS — BP 116/80 | HR 80 | Temp 96.9°F | Ht 66.5 in | Wt 204.8 lb

## 2011-11-06 DIAGNOSIS — N529 Male erectile dysfunction, unspecified: Secondary | ICD-10-CM

## 2011-11-06 DIAGNOSIS — Z1211 Encounter for screening for malignant neoplasm of colon: Secondary | ICD-10-CM

## 2011-11-06 DIAGNOSIS — F528 Other sexual dysfunction not due to a substance or known physiological condition: Secondary | ICD-10-CM

## 2011-11-06 DIAGNOSIS — E1136 Type 2 diabetes mellitus with diabetic cataract: Secondary | ICD-10-CM

## 2011-11-06 DIAGNOSIS — Z79899 Other long term (current) drug therapy: Secondary | ICD-10-CM

## 2011-11-06 DIAGNOSIS — Z0000 Encounter for general adult medical examination without abnormal findings: Secondary | ICD-10-CM

## 2011-11-06 DIAGNOSIS — E119 Type 2 diabetes mellitus without complications: Secondary | ICD-10-CM

## 2011-11-06 DIAGNOSIS — G8929 Other chronic pain: Secondary | ICD-10-CM

## 2011-11-06 DIAGNOSIS — E1139 Type 2 diabetes mellitus with other diabetic ophthalmic complication: Secondary | ICD-10-CM

## 2011-11-06 DIAGNOSIS — M549 Dorsalgia, unspecified: Secondary | ICD-10-CM

## 2011-11-06 DIAGNOSIS — E785 Hyperlipidemia, unspecified: Secondary | ICD-10-CM

## 2011-11-06 LAB — POCT GLYCOSYLATED HEMOGLOBIN (HGB A1C): Hemoglobin A1C: 12.2

## 2011-11-06 LAB — LIPID PANEL
HDL: 57 mg/dL (ref >39)
LDL Cholesterol: 187 mg/dL — ABNORMAL HIGH (ref 0–99)
Triglycerides: 194 mg/dL — ABNORMAL HIGH (ref <150)
VLDL: 39 mg/dL (ref 0–40)

## 2011-11-06 MED ORDER — LANCETS MISC: Status: DC

## 2011-11-06 MED ORDER — ASPIRIN 81 MG PO TBEC: 81.0000 mg | DELAYED_RELEASE_TABLET | Freq: Every day | ORAL | Status: DC

## 2011-11-06 MED ORDER — PRAVASTATIN SODIUM 20 MG PO TABS: 20.0000 mg | ORAL_TABLET | Freq: Every evening | ORAL | Status: DC

## 2011-11-06 MED ORDER — SILDENAFIL CITRATE 100 MG PO TABS: 100.0000 mg | ORAL_TABLET | ORAL | Status: DC | PRN

## 2011-11-06 MED ORDER — GLIPIZIDE ER 5 MG PO TB24: 5.0000 mg | ORAL_TABLET | Freq: Every day | ORAL | Status: DC

## 2011-11-06 MED ORDER — TRAMADOL HCL 50 MG PO TABS: 50.0000 mg | ORAL_TABLET | Freq: Two times a day (BID) | ORAL | Status: DC | PRN

## 2011-11-06 MED ORDER — METFORMIN HCL 500 MG PO TABS: 500.0000 mg | ORAL_TABLET | Freq: Two times a day (BID) | ORAL | Status: DC

## 2011-11-06 MED ORDER — GLUCOSE BLOOD VI STRP: ORAL_STRIP | Status: AC

## 2011-11-06 NOTE — Assessment & Plan Note (Signed)
Recommended f/u w ophthalmologist. Last exam in 2011.

## 2011-11-06 NOTE — Assessment & Plan Note (Signed)
Pt due for colonoscopy. No hematochezia/melena.  - GI referral made today.
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
## 2011-11-06 NOTE — Assessment & Plan Note (Addendum)
Pt w chronic pain which remains well controlled w tramadol. No warning/alarm symptoms. - Gave Rx for tramadol 50 mg 60 tablets w 1 RF

## 2011-11-06 NOTE — Assessment & Plan Note (Signed)
Lab Results  Component Value Date   HGBA1C 12.2 11/06/2011   HGBA1C 7.1* 08/28/2010   CREATININE 0.82 09/01/2010   CREATININE 1.14 06/27/2010   MICROALBUR 0.68 06/27/2010   MICRALBCREAT 2.7 06/27/2010   CHOL 266* 06/27/2010   HDL 53 06/27/2010   TRIG 99 06/27/2010    Last eye exam and foot exam:    Component Value Date/Time   HMDIABEYEEXA no diabetic retinopathy. Cataract OD 03/30/2009   HMDIABFOOTEX done 09/27/2008    Assessment: Diabetes control: not controlled Progress toward goals: deteriorated Barriers to meeting goals: no barriers identified  Plan: Diabetes treatment: Increase Metformin to 500mg  BIDx1 week, then increase to 1000mg  BID. Continue Glucotrol 5mg  Refer to: diabetes educator for self-management training and diabetes educator for medical nutrition therapy Instruction/counseling given: reminded to get eye exam, reminded to bring blood glucose meter & log to each visit, reminded to bring medications to each visit, discussed foot care, discussed diet and other instruction/counseling:    Pt w tremendous jump in A1c in just over 1 year. Endorses compliance w metformin 500qd and glucotrol 5mg  qd. Pt given contour glucometer and Rx for strips. Instructed to check BG qam every other day and bring values to next clinic appt in 1 mo. Will reassess glucose control at that time. Ultimately, pt may require insulin therapy. Pt to make appt w Norm Parcel in 1 mo.

## 2011-11-06 NOTE — Assessment & Plan Note (Signed)
Lab Results  Component Value Date   CHOL 266* 06/27/2010   HDL 53 06/27/2010   LDLCALC 193* 06/27/2010   TRIG 99 06/27/2010   CHOLHDL 5.0 06/27/2010   Pt's last LDL in 2012 193. Not on statin therapy, has taken statin in the past and can't remember name, SE of pruritis. Says he has tried to eat low fat foods. - Repeat lipid panel today. - Start pravastatin 20mg  daily, titrate up as tolerated next visit

## 2011-11-06 NOTE — Patient Instructions (Addendum)
1. Please take Metformin 500mg  twice a day for ONE WEEK, then increase to 1000mg  (2 pills) twice a day until your next appointment. 2. Please check your blood sugars every other morning BEFORE eating. Please bring your meter and these values to your next appointment. 3. Please start taking pravastatin 20mg  daily for high cholesterol. 4. Please return to the clinic in 1 month for a follow-up appt and to see our DIabetes educator, Lupita Leash Plyler

## 2011-11-06 NOTE — Assessment & Plan Note (Addendum)
Pt w occasional ED, takes viagra. No etiology identified, may be related to DM and/or atherosclerotic dz. Would like refill today. - Refilled viagra.  Counseled on safe use and SE

## 2011-11-07 ENCOUNTER — Other Ambulatory Visit: Payer: Self-pay | Admitting: Internal Medicine

## 2011-11-07 LAB — BASIC METABOLIC PANEL WITH GFR
CO2: 27 mEq/L (ref 19–32)
Calcium: 10.4 mg/dL (ref 8.4–10.5)
Creat: 0.91 mg/dL (ref 0.50–1.35)
GFR, Est African American: >89 mL/min
Glucose, Bld: 201 mg/dL — ABNORMAL HIGH (ref 70–99)

## 2011-11-07 LAB — MICROALBUMIN / CREATININE URINE RATIO: Microalb, Ur: 1.34 mg/dL (ref 0.00–1.89)

## 2011-11-08 ENCOUNTER — Other Ambulatory Visit: Payer: Self-pay | Admitting: Internal Medicine

## 2011-11-08 NOTE — Telephone Encounter (Signed)
Please evaluate for refill!  Thanks! 

## 2011-11-13 ENCOUNTER — Telehealth: Payer: Self-pay | Admitting: *Deleted

## 2011-11-13 NOTE — Telephone Encounter (Signed)
RTC from and to pt.  Pt said that his [employer] Card has expired and plans to come in to renew his Card..  Pt was asked to inform the Clinics so that an appointment can be set up for his Colonoscopy.  Angelina Ok, RN 11/13/2011 4:00 PM.

## 2011-11-13 NOTE — Telephone Encounter (Signed)
Call to pt message left to see if patient has brought in information to [facility] to see if he qualifies for the [employer].  Angelina Ok, RN 11/13/2011 9:28 AM

## 2011-12-10 ENCOUNTER — Encounter: Payer: Self-pay | Admitting: Internal Medicine

## 2011-12-10 ENCOUNTER — Ambulatory Visit (INDEPENDENT_AMBULATORY_CARE_PROVIDER_SITE_OTHER): Payer: Self-pay | Admitting: Internal Medicine

## 2011-12-10 ENCOUNTER — Ambulatory Visit (INDEPENDENT_AMBULATORY_CARE_PROVIDER_SITE_OTHER): Payer: Self-pay | Admitting: Dietician

## 2011-12-10 VITALS — BP 117/66 | HR 79 | Temp 98.6°F | Ht 66.0 in | Wt 214.6 lb

## 2011-12-10 DIAGNOSIS — E119 Type 2 diabetes mellitus without complications: Secondary | ICD-10-CM

## 2011-12-10 DIAGNOSIS — E785 Hyperlipidemia, unspecified: Secondary | ICD-10-CM

## 2011-12-10 DIAGNOSIS — Z0000 Encounter for general adult medical examination without abnormal findings: Secondary | ICD-10-CM

## 2011-12-10 DIAGNOSIS — M549 Dorsalgia, unspecified: Secondary | ICD-10-CM

## 2011-12-10 LAB — GLUCOSE, CAPILLARY: Glucose-Capillary: 160 mg/dL — ABNORMAL HIGH (ref 70–99)

## 2011-12-10 MED ORDER — METFORMIN HCL 1000 MG PO TABS: 1000.0000 mg | ORAL_TABLET | Freq: Two times a day (BID) | ORAL | Status: DC

## 2011-12-10 MED ORDER — METFORMIN HCL 500 MG PO TABS: ORAL_TABLET | ORAL | Status: DC

## 2011-12-10 MED ORDER — TRAMADOL HCL 50 MG PO TABS: 50.0000 mg | ORAL_TABLET | Freq: Two times a day (BID) | ORAL | Status: DC | PRN

## 2011-12-10 MED ORDER — PRAVASTATIN SODIUM 20 MG PO TABS: 20.0000 mg | ORAL_TABLET | Freq: Every evening | ORAL | Status: DC

## 2011-12-10 NOTE — Assessment & Plan Note (Addendum)
At the patient's last visit, his A1C was noted to have increased from 7.1 one year prior, to 12.2, representing an average blood sugar around 350 mg/dL.  However, his glucometer and log book show no CBG values over 200, and one value as low as 86, as well as one episode of reported hypoglycemia (not recorded in log book).  It's difficult to reconcile these two numbers.  Perhaps the A1C was a lab error, or the patient was completely non-compliant with oral hypoglycemics prior to his last visit. -after discussing with Dr. Kem Kays, we're concerned about increasing his metformin to the full 1000 mg BID given his episode of hypoglycemia.  At this time, we will increase to 1000 mg qam, 500 mg qpm -continue glipizide 5 -patient to see Lupita Leash Plyler today -recheck A1C at next visit in 2-3 months

## 2011-12-10 NOTE — Patient Instructions (Signed)
Your blood sugars have significantly improved since your last visit. -increase your Metformin to 1000 mg (2 tablets) every morning, and 500 mg (1 tablet) every evening -bring your glucometer to your next visit  We will check your liver function today, after starting Pravastatin at your last visit.  We will call you if the results are abnormal.  If you do not hear from [place], your results were normal.  Please return for a follow-up visit with your PCP in 2-3 months.
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
## 2011-12-10 NOTE — Assessment & Plan Note (Signed)
-  Patient declined flu shot at this time, stating he had to "prepare himself" for the shot.  Readdress at next visit. -Patient given information on necessary documents for applying for [facility] -Will follow-up on colonoscopy at next visit, after patient has received [employer]

## 2011-12-10 NOTE — Assessment & Plan Note (Signed)
Patient started on pravastatin at his last visit -check LFT's today

## 2011-12-10 NOTE — Progress Notes (Signed)
Diabetes Self-Management Training (DSMT)  Initial Visit  12/10/2011 [name], identified by name and date of birth, is a 51 y.o. male with Type 2 Diabetes. Year of diabetes diagnosis: 2001  ASSESSMENT Patient concerns are Nutrition/meal planning, Medication and Glycemic control.  There were no vitals taken for this visit. There is no height or weight on file to calculate BMI. Lab Results  Component Value Date   LDLCALC 187* 11/06/2011   Lab Results  Component Value Date   HGBA1C 12.2 11/06/2011   Labs reviewed.  Family history of diabetes: Yes Patients belief/attitude about diabetes: Diabetes can be controlled. Self foot exams daily: Yes Diabetes Complications: Neuropathy Support systems: spouse Special needs: None Prior DM Education: Yes- limited   Medications See Medications list.  Is interested in learning more and Needs skills/knowledge review    Exercise Plan Doing ADLs and yard work for 30 minutesa day.   Self-Monitoring Monitor: contour EZ Frequency of testing: 3-5 times/week Breakfast: 100s Hyperglycemia: No Hypoglycemia: Yes Rarely   Meal Planning Limited knowledge and Interested in improving   Assessment comments: does not have orange card yet. Encouraged patient to make another appointment.    INDIVIDUAL DIABETES EDUCATION PLAN:  Nutrition management Medication Acute complication: _______________________________________________________________________  Intervention TOPICS COVERED TODAY:  Nutrition management  Role of diet in the treatment of diabetes and the relationship between the three main macronutritents and blood glucose control. Medication  Reviewed patients medication for diabetes, action, purpose, timing of dose and side effects. Acute complication  Taught treatment of hypoglycemia - the 15 rule.  PATIENTS GOALS/PLAN (copy and paste in patient instructions so patient receives a copy): 1.  Learning Objective:       State importance  of eating carbs throughout the day 2.  Behavioral Objective:         Medications: To improve blood glucose levels, I will take my medication as prescribed Half of the time 50%  Personalized Follow-Up Plan for Ongoing Self Management Support:  Doctor's Office, family and CDE visits ______________________________________________________________________   Outcomes Expected outcomes: Demonstrated interest in learning.Expect positive changes in lifestyle. Self-care Barriers: Lack of material resources Education material provided: yes Patient to contact team via Phone if problems or questions. Time in: 1430     Time out: 1500 Future DSMT - as needed, encouraged patient to get orange card   Plyler, Lupita Leash

## 2011-12-11 LAB — COMPLETE METABOLIC PANEL WITH GFR
ALT: 16 U/L (ref 0–53)
AST: 14 U/L (ref 0–37)
Alkaline Phosphatase: 67 U/L (ref 39–117)
BUN: 10 mg/dL (ref 6–23)
Calcium: 9.9 mg/dL (ref 8.4–10.5)
Chloride: 105 mEq/L (ref 96–112)
Creat: 0.88 mg/dL (ref 0.50–1.35)
Potassium: 4 mEq/L (ref 3.5–5.3)

## 2012-01-11 ENCOUNTER — Other Ambulatory Visit: Payer: Self-pay | Admitting: Internal Medicine

## 2012-01-21 NOTE — Addendum Note (Signed)
Addended by: Neomia Dear on: 01/21/2012 06:15 PM   Modules accepted: Orders

## 2012-01-29 ENCOUNTER — Other Ambulatory Visit: Payer: Self-pay | Admitting: Internal Medicine

## 2012-02-19 ENCOUNTER — Encounter: Payer: Self-pay | Admitting: Internal Medicine

## 2012-02-28 ENCOUNTER — Other Ambulatory Visit: Payer: Self-pay | Admitting: Internal Medicine

## 2012-03-19 ENCOUNTER — Ambulatory Visit: Payer: Self-pay | Admitting: Internal Medicine

## 2012-04-09 ENCOUNTER — Other Ambulatory Visit: Payer: Self-pay | Admitting: Internal Medicine

## 2012-05-06 ENCOUNTER — Other Ambulatory Visit: Payer: Self-pay | Admitting: *Deleted

## 2012-05-06 MED ORDER — TRAMADOL HCL 50 MG PO TABS: 50.0000 mg | ORAL_TABLET | Freq: Two times a day (BID) | ORAL | Status: DC | PRN

## 2012-05-31 ENCOUNTER — Other Ambulatory Visit: Payer: Self-pay | Admitting: Internal Medicine

## 2012-06-01 ENCOUNTER — Other Ambulatory Visit: Payer: Self-pay | Admitting: Internal Medicine

## 2012-06-28 ENCOUNTER — Other Ambulatory Visit: Payer: Self-pay | Admitting: Internal Medicine

## 2012-07-08 ENCOUNTER — Encounter: Payer: Self-pay | Admitting: Internal Medicine

## 2012-07-16 ENCOUNTER — Other Ambulatory Visit: Payer: Self-pay | Admitting: *Deleted

## 2012-07-16 MED ORDER — TRAMADOL HCL 50 MG PO TABS: 50.0000 mg | ORAL_TABLET | Freq: Two times a day (BID) | ORAL | Status: DC | PRN

## 2012-07-16 MED ORDER — METFORMIN HCL 500 MG PO TABS: ORAL_TABLET | ORAL | Status: DC

## 2012-07-16 NOTE — Telephone Encounter (Signed)
Pt has an appt June 3 w/Dr Ziemer.

## 2012-07-16 NOTE — Telephone Encounter (Signed)
Refills done, pt called - left message.

## 2012-08-05 ENCOUNTER — Other Ambulatory Visit: Payer: Self-pay | Admitting: *Deleted

## 2012-08-05 ENCOUNTER — Encounter: Payer: Self-pay | Admitting: Internal Medicine

## 2012-08-05 ENCOUNTER — Encounter: Payer: Self-pay | Admitting: Dietician

## 2012-08-05 NOTE — Telephone Encounter (Signed)
wr chart 

## 2012-08-11 ENCOUNTER — Encounter: Payer: Self-pay | Admitting: Internal Medicine

## 2012-08-11 ENCOUNTER — Ambulatory Visit (INDEPENDENT_AMBULATORY_CARE_PROVIDER_SITE_OTHER): Payer: BC Managed Care – PPO | Admitting: Internal Medicine

## 2012-08-11 VITALS — BP 114/74 | HR 74 | Temp 97.3°F | Ht 66.5 in | Wt 210.5 lb

## 2012-08-11 DIAGNOSIS — E1136 Type 2 diabetes mellitus with diabetic cataract: Secondary | ICD-10-CM

## 2012-08-11 DIAGNOSIS — Z1211 Encounter for screening for malignant neoplasm of colon: Secondary | ICD-10-CM

## 2012-08-11 DIAGNOSIS — E785 Hyperlipidemia, unspecified: Secondary | ICD-10-CM

## 2012-08-11 DIAGNOSIS — F528 Other sexual dysfunction not due to a substance or known physiological condition: Secondary | ICD-10-CM

## 2012-08-11 DIAGNOSIS — M549 Dorsalgia, unspecified: Secondary | ICD-10-CM

## 2012-08-11 DIAGNOSIS — Z0000 Encounter for general adult medical examination without abnormal findings: Secondary | ICD-10-CM

## 2012-08-11 DIAGNOSIS — E119 Type 2 diabetes mellitus without complications: Secondary | ICD-10-CM

## 2012-08-11 LAB — GLUCOSE, CAPILLARY: Glucose-Capillary: 165 mg/dL — ABNORMAL HIGH (ref 70–99)

## 2012-08-11 MED ORDER — METFORMIN HCL 1000 MG PO TABS: 1000.0000 mg | ORAL_TABLET | Freq: Two times a day (BID) | ORAL | Status: DC

## 2012-08-11 MED ORDER — ATORVASTATIN CALCIUM 20 MG PO TABS: 20.0000 mg | ORAL_TABLET | Freq: Every day | ORAL | Status: DC

## 2012-08-11 NOTE — Progress Notes (Signed)
Patient ID: Jacob Durham, male   DOB: 09/26/1960, 52 y.o.   MRN: [id number]  Subjective:   Patient ID: Jacob Durham male   DOB: 1961-02-15 52 y.o.   MRN: [id number]  HPI: Jacob Durham is a 52 y.o. male with history of T2DM, HLD, chronic LBP presenting to the clinic for followup.  DM2 In regards to his diabetes, he is taking metformin 1000 mg in the morning and 500 mg the evening as well as glipizide 5 mg daily. He says that he lost his glucose meter is in the process of getting a new one. He denies any signs or symptoms of hypoglycemia. He denies any numbness or blisters of his feet.  Chronic low back pain This is been an issue for him ever since a sports injury years ago. He takes tramadol which helps with the pain and his mobility. He says he has not tried physical therapy before, but is interested today. Denies any numbness or weakness of legs, saddle anesthesia, bowel/bladder incontinence.  Health maintenance Due for colon cancer screening, and now has Nurse, learning disability. Willing to followup with GI referral.   Past Medical History  Diagnosis Date  . Diabetes mellitus   . Diabetic cataract(366.41)   . Erectile dysfunction   . Hyperlipidemia   . Chronic back pain    Current Outpatient Prescriptions  Medication Sig Dispense Refill  . aspirin 81 MG EC tablet Take 1 tablet (81 mg total) by mouth daily. With meal  30 tablet  11  . glipiZIDE (GLUCOTROL XL) 5 MG 24 hr tablet TAKE 1 TABLET (5 MG TOTAL) BY MOUTH DAILY.  30 tablet  4  . glipiZIDE (GLUCOTROL XL) 5 MG 24 hr tablet TAKE 1 TABLET (5 MG TOTAL) BY MOUTH DAILY.  30 tablet  4  . glucose blood (BAYER CONTOUR NEXT TEST) test strip Use as instructed  100 each  12  . ibuprofen (ADVIL,MOTRIN) 200 MG tablet Take 200-400 mg by mouth every 6 (six) hours as needed. For pain.       . Lancets MISC Use to test blood sugar every other morning before eating  100 each  2  . metFORMIN (GLUCOPHAGE) 500 MG tablet Take 2 tablets by mouth every  morning, and 1 tablet every evening.  90 tablet  5  . Omega-3 Fatty Acids (FISH OIL) 1000 MG CAPS Take 1 capsule by mouth 2 (two) times daily.        . pravastatin (PRAVACHOL) 20 MG tablet Take 1 tablet (20 mg total) by mouth every evening.  30 tablet  11  . sildenafil (VIAGRA) 100 MG tablet Take 1 tablet (100 mg total) by mouth as needed for erectile dysfunction. 45 minutes before needed. Do not take more than one tablet per day.  10 tablet  0  . traMADol (ULTRAM) 50 MG tablet TAKE 1 TABLET (50 MG TOTAL) BY MOUTH 2 (TWO) TIMES DAILY AS NEEDED FOR PAIN.  60 tablet  1  . traMADol (ULTRAM) 50 MG tablet Take 1 tablet (50 mg total) by mouth 2 (two) times daily as needed for pain.  60 tablet  0   No current facility-administered medications for this visit.   Family History  Problem Relation Age of Onset  . Diabetes Maternal Grandmother   . Hypertension Maternal Grandmother    History   Social History  . Marital Status: Married    Spouse Name: N/A    Number of Children: N/A  . Years of Education: N/A  Social History Main Topics  . Smoking status: Former Smoker -- 0.25 packs/day for 5 years    Types: Cigarettes    Quit date: 03/07/2001  . Smokeless tobacco: None  . Alcohol Use: 8.4 oz/week    14 Cans of beer per week  . Drug Use: None  . Sexually Active: None   Other Topics Concern  . None   Social History Narrative   Financial assistance approved for 100% discount at [facility] and has [facility] card per [employer]   12/02/2009         Review of Systems: 10 pt ROS performed, pertinent positives and negatives noted in HPI Objective:  Physical Exam: Filed Vitals:   08/11/12 0939  BP: 114/74  Pulse: 74  Temp: 97.3 F (36.3 C)  TempSrc: Oral  Height: 5' 6.5" (1.689 m)  Weight: 210 lb 8 oz (95.482 kg)  SpO2: 99%   Constitutional: Vital signs reviewed. Patient is a well-developed and well-nourished AAM in no acute distress and cooperative with exam. Alert and oriented x3.  Head:  Normocephalic and atraumatic  Mouth: no erythema or exudates, MMM  Eyes: PERRL, EOMI, conjunctivae normal, No scleral icterus.   Cardiovascular: RRR, S1 normal, S2 normal, no MRG, pulses symmetric and intact bilaterally  Pulmonary/Chest: CTAB, no wheezes, rales, or rhonchi  Abdominal: Soft. Non-tender, non-distended, bowel sounds are normal, no masses, organomegaly, or guarding present.  Musculoskeletal: Some TTP of R sided lumbar spine, ful ROM and strenght of bilateral hip joints.  Hematology: no cervical, inginal, or axillary adenopathy.  Neurological: A&O x3, Strength is normal and symmetric bilaterally, cranial nerve II-XII are grossly intact, no focal motor deficit, sensory intact to light touch bilaterally.  Skin: Healing cut to right second finger tip.   Psychiatric: Normal mood and affect. speech and behavior is normal. Judgment and thought content normal. Cognition and memory are normal.  Assessment & Plan:   Please see problem-based charting for assessment and plan.

## 2012-08-11 NOTE — Assessment & Plan Note (Signed)
Referral today for ophtho

## 2012-08-11 NOTE — Assessment & Plan Note (Signed)
Lab Results  Component Value Date   CHOL 283* 11/06/2011   HDL 57 11/06/2011   LDLCALC 469* 11/06/2011   TRIG 194* 11/06/2011   CHOLHDL 5.0 11/06/2011   Lab Results  Component Value Date   ALT 16 12/10/2011   AST 14 12/10/2011   ALKPHOS 67 12/10/2011   BILITOT 0.7 12/10/2011   Been on pravastatin 20mg  daily and tolerated well. Due to pt's DM, will need higher potency statin if able to tolerate. Rxed atorvastatin 20mg  to be titrated up as tolerated. Pt to call if SE.

## 2012-08-11 NOTE — Assessment & Plan Note (Signed)
Lab Results  Component Value Date   HGBA1C 7.2 08/11/2012   HGBA1C 12.2 11/06/2011   HGBA1C 7.1* 08/28/2010     Assessment: Diabetes control: good control (HgbA1C at goal) Progress toward A1C goal:  improved   Plan: Medications:  Continue glipizide 5mg  daily, increase metformin to 1000 BID Instruction/counseling given: reminded to bring blood glucose meter & log to each visit Educational resources provided: brochure Self management tools provided: home glucose logbook

## 2012-08-11 NOTE — Assessment & Plan Note (Signed)
Chronic pain on tramadol. No warning s/s. Interested in PT for back. - PT referral made today - Continue tramadol 50mg  #60/month

## 2012-08-11 NOTE — Patient Instructions (Signed)
1. I have prescribed a stronger metformin pill (1000mg ) - you will take one tablet TWICE a day.  2. STOP taking pravastatin and start taking atorvastatin (lipitor) 20mg . Call me if you have side effects. 3. We will make referrals to physical therapy, eye doctor, and colonoscopy.  4. Come back and see [place] in 3 months.

## 2012-08-11 NOTE — Assessment & Plan Note (Signed)
Referred today for colonoscopy. 

## 2012-08-12 NOTE — Progress Notes (Signed)
Case discussed with Dr. Ziemer immediately after the resident saw the patient.  We reviewed the resident's history and exam and pertinent patient test results.  I agree with the assessment, diagnosis and plan of care documented in the resident's note. 

## 2012-08-21 ENCOUNTER — Encounter: Payer: Self-pay | Admitting: Internal Medicine

## 2012-08-27 ENCOUNTER — Ambulatory Visit: Payer: BC Managed Care – PPO | Attending: Internal Medicine | Admitting: Physical Therapy

## 2012-08-27 DIAGNOSIS — IMO0001 Reserved for inherently not codable concepts without codable children: Secondary | ICD-10-CM | POA: Insufficient documentation

## 2012-08-27 DIAGNOSIS — M545 Low back pain, unspecified: Secondary | ICD-10-CM | POA: Insufficient documentation
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
## 2012-09-11 ENCOUNTER — Other Ambulatory Visit: Payer: Self-pay

## 2012-09-26 NOTE — Addendum Note (Signed)
Addended by: Neomia Dear on: 09/26/2012 09:45 AM   Modules accepted: Orders

## 2012-11-20 ENCOUNTER — Other Ambulatory Visit: Payer: Self-pay | Admitting: *Deleted

## 2012-11-20 MED ORDER — TRAMADOL HCL 50 MG PO TABS
50.0000 mg | ORAL_TABLET | Freq: Two times a day (BID) | ORAL | Status: DC | PRN
Start: 2012-11-20 — End: 2012-12-24

## 2012-11-20 NOTE — Telephone Encounter (Signed)
Last filled 10/27/12 

## 2012-11-20 NOTE — Telephone Encounter (Signed)
The patient is being given 1 months supply, 60 tablets only. The patient is advised to schedule an appointment and sign a pain contract if he needs further pain medications.

## 2012-11-20 NOTE — Telephone Encounter (Signed)
Tramadol rx called to [facility] pharmacy also left message for pt to sched an appt. Also message sent to front office.

## 2012-11-24 ENCOUNTER — Encounter: Payer: Self-pay | Admitting: Internal Medicine

## 2012-11-24 ENCOUNTER — Ambulatory Visit (INDEPENDENT_AMBULATORY_CARE_PROVIDER_SITE_OTHER): Payer: BC Managed Care – PPO | Admitting: Internal Medicine

## 2012-11-24 VITALS — BP 104/69 | HR 62 | Temp 99.0°F | Ht 66.0 in | Wt 204.3 lb

## 2012-11-24 DIAGNOSIS — E785 Hyperlipidemia, unspecified: Secondary | ICD-10-CM

## 2012-11-24 DIAGNOSIS — Z0000 Encounter for general adult medical examination without abnormal findings: Secondary | ICD-10-CM

## 2012-11-24 DIAGNOSIS — M549 Dorsalgia, unspecified: Secondary | ICD-10-CM

## 2012-11-24 DIAGNOSIS — E1159 Type 2 diabetes mellitus with other circulatory complications: Secondary | ICD-10-CM

## 2012-11-24 LAB — COMPREHENSIVE METABOLIC PANEL
ALT: 16 U/L (ref 0–53)
Albumin: 4.4 g/dL (ref 3.5–5.2)
Alkaline Phosphatase: 70 U/L (ref 39–117)
CO2: 28 mEq/L (ref 19–32)
Potassium: 4 mEq/L (ref 3.5–5.3)
Sodium: 137 mEq/L (ref 135–145)
Total Bilirubin: 0.7 mg/dL (ref 0.3–1.2)
Total Protein: 7.3 g/dL (ref 6.0–8.3)

## 2012-11-24 LAB — GLUCOSE, CAPILLARY: Glucose-Capillary: 147 mg/dL — ABNORMAL HIGH (ref 70–99)

## 2012-11-24 LAB — LIPID PANEL
Cholesterol: 225 mg/dL — ABNORMAL HIGH (ref 0–200)
HDL: 62 mg/dL
LDL Cholesterol: 140 mg/dL — ABNORMAL HIGH (ref 0–99)
Total CHOL/HDL Ratio: 3.6 ratio
Triglycerides: 115 mg/dL
VLDL: 23 mg/dL (ref 0–40)

## 2012-11-24 MED ORDER — GABAPENTIN 100 MG PO CAPS: 100.0000 mg | ORAL_CAPSULE | Freq: Three times a day (TID) | ORAL | Status: DC

## 2012-11-24 NOTE — Assessment & Plan Note (Signed)
Currently taking Lipitor 20 mg. Last Lipid panel was done in 9/13.  -Will repeat lipid panel today.

## 2012-11-24 NOTE — Patient Instructions (Addendum)
General Instructions: 1. Please make follow up appt. For 1 month. 2. Please take all medications as prescribed. Take Neurontin 100 mg 3 times daily. 3. If you have worsening of your symptoms or new symptoms arise, please call the clinic ([phone]), or go to the ER immediately if symptoms are severe.  You have done great job in taking all your medications. I appreciate it very much. Please continue doing that.    Treatment Goals:  Goals (1 Years of Data) as of 11/24/12         As of Today 08/11/12 12/10/11 11/06/11     Blood Pressure    . Blood Pressure < 140/90  104/69 114/74 117/66 116/80     Result Component    . HEMOGLOBIN A1C < 7.0  6.8 7.2  12.2    . LDL CALC < 100     187      Progress Toward Treatment Goals:  Treatment Goal 11/24/2012  Hemoglobin A1C improved  Prevent falls at goal    Self Care Goals & Plans:  Self Care Goal 11/24/2012  Manage my medications take my medicines as prescribed; bring my medications to every visit; refill my medications on time  Monitor my health -  Eat healthy foods drink diet soda or water instead of juice or soda; eat more vegetables; eat foods that are low in salt; eat baked foods instead of fried foods; eat fruit for snacks and desserts  Be physically active -    Home Blood Glucose Monitoring 11/24/2012  Check my blood sugar once a day  When to check my blood sugar before breakfast     Care Management & Community Referrals:  Referral 11/24/2012  Referrals made for care management support none needed  Referrals made to community resources -

## 2012-11-24 NOTE — Assessment & Plan Note (Signed)
Patient complains of chronic back pain, moderately well controlled on Tramadol. He claims he takes 2 tabs every morning before work for his back pain. In discussing his pain, it seems that it is more of a neurologic pain, radiating into his legs and intermittent, sharp in nature, only elicited by certain movements. -Started patient on Neurontin 100 mg tid for neuropathic pain. -Encouraged PT exercises at home as well as heat/ice as discussed previously at PT appointment in June. -No further Tramadol needed at this time. Patient recently given Rx for #60 and has not picked up refill yet.  -Will discuss pain contract at next visit if patient is still requiring Tramadol.

## 2012-11-24 NOTE — Assessment & Plan Note (Signed)
Given flu shot  today 

## 2012-11-24 NOTE — Progress Notes (Signed)
Patient ID: Jacob Durham, male   DOB: 1960/11/02, 52 y.o.   MRN: [id number]   Subjective:   Patient ID: Jacob Durham male   DOB: 08-Aug-1960 52 y.o.   MRN: [id number]  HPI: Jacob Durham is a 52 y.o. M w/ PMHx of DM type II, HLD, chronic back pain, and ED, presents to the clinic today for a follow up appointment. His only complaint today is his back pain. He claims this pain is an intermittent sharp pain that radiates/shoots down into his legs with certain movements. He does claim the pain is moderately controlled by Tramadol which he says he takes 2 each morning. He has been seen by PT in the past (08/27/12), where they instructed him to do home exercises as well as heat/ice for pain relief. Otherwise, the patient has no complaints. He says he has been feeling well overall, taking his medications regularly. He checks his blood sugars at home and says they are always in the low 100's. His HbA1c on 08/11/12 was 7.2, repeat today was 6.8. His current medication regimen is Metformin 1000 mg bid and Glipizide 5 mg qd. The patient was also given referrals in the past for colonoscopy and an eye exam, however he has not gone yet this year. He is planning to go in the next couple weeks as he recently got a new health insurance.    Past Medical History  Diagnosis Date  . Diabetes mellitus   . Diabetic cataract(366.41)   . Erectile dysfunction   . Hyperlipidemia   . Chronic back pain    Current Outpatient Prescriptions  Medication Sig Dispense Refill  . aspirin 81 MG EC tablet Take 1 tablet (81 mg total) by mouth daily. With meal  30 tablet  11  . atorvastatin (LIPITOR) 20 MG tablet Take 1 tablet (20 mg total) by mouth daily.  30 tablet  11  . gabapentin (NEURONTIN) 100 MG capsule Take 1 capsule (100 mg total) by mouth 3 (three) times daily.  90 capsule  2  . glipiZIDE (GLUCOTROL XL) 5 MG 24 hr tablet TAKE 1 TABLET (5 MG TOTAL) BY MOUTH DAILY.  30 tablet  4  . ibuprofen (ADVIL,MOTRIN) 200 MG tablet  Take 200-400 mg by mouth every 6 (six) hours as needed. For pain.       . Lancets MISC Use to test blood sugar every other morning before eating  100 each  2  . metFORMIN (GLUCOPHAGE) 1000 MG tablet Take 1 tablet (1,000 mg total) by mouth 2 (two) times daily with a meal.  60 tablet  5  . Omega-3 Fatty Acids (FISH OIL) 1000 MG CAPS Take 1 capsule by mouth 2 (two) times daily.        . sildenafil (VIAGRA) 100 MG tablet Take 1 tablet (100 mg total) by mouth as needed for erectile dysfunction. 45 minutes before needed. Do not take more than one tablet per day.  10 tablet  0  . traMADol (ULTRAM) 50 MG tablet Take 1 tablet (50 mg total) by mouth 2 (two) times daily as needed for pain.  60 tablet  0   No current facility-administered medications for this visit.   Family History  Problem Relation Age of Onset  . Diabetes Maternal Grandmother   . Hypertension Maternal Grandmother    History   Social History  . Marital Status: Married    Spouse Name: N/A    Number of Children: N/A  . Years of Education: N/A  Social History Main Topics  . Smoking status: Former Smoker -- 0.25 packs/day for 5 years    Types: Cigarettes    Quit date: 03/07/2001  . Smokeless tobacco: None  . Alcohol Use: 8.4 oz/week    14 Cans of beer per week  . Drug Use: None  . Sexual Activity: None   Other Topics Concern  . None   Social History Narrative   Financial assistance approved for 100% discount at [facility] and has [facility] card per [employer]   12/02/2009         Review of Systems: General: Denies fever, chills, diaphoresis, appetite change and fatigue.  Respiratory: Denies SOB, DOE, cough, chest tightness, and wheezing.   Cardiovascular: Denies chest pain, palpitations and leg swelling.  Gastrointestinal: Denies nausea, vomiting, abdominal pain, diarrhea, constipation, blood in stool and abdominal distention.  Genitourinary: Denies dysuria, urgency, frequency, hematuria, flank pain and difficulty urinating.    Endocrine: Denies hot or cold intolerance, sweats, polyuria, polydipsia. Musculoskeletal: Positive for back pain, radiating into the LE's. Denies myalgias, joint swelling, arthralgias and gait problem.  Skin: Denies pallor, rash and wounds.  Neurological: Denies dizziness, seizures, syncope, weakness, lightheadedness, numbness and headaches.  Psychiatric/Behavioral: Denies mood changes, confusion, nervousness, sleep disturbance and agitation.  Objective:  Physical Exam: Filed Vitals:   11/24/12 1329  BP: 104/69  Pulse: 62  Temp: 99 F (37.2 C)  TempSrc: Oral  Height: 5\' 6"  (1.676 m)  Weight: 204 lb 4.8 oz (92.67 kg)  SpO2: 99%   General: Vital signs reviewed.  Patient is a well-developed and well-nourished, in no acute distress and cooperative with exam. Alert and oriented x3.  Eyes: PERRL, EOMI, conjunctivae normal, No scleral icterus.  Neck: Supple, trachea midline, normal ROM, No JVD, masses, thyromegaly, or carotid bruit present.  Cardiovascular: RRR, S1 normal, S2 normal, no murmurs, gallops, or rubs. Pulmonary/Chest: normal respiratory effort, CTAB, no wheezes, rales, or rhonchi. Abdominal: Soft. Non-tender, non-distended, bowel sounds are normal, no masses, organomegaly, or guarding present.  Musculoskeletal: Tenderness over sacral region, bilaterally. No joint deformities, erythema, or stiffness, ROM full and no nontender. Extremities: No swelling or edema,  pulses symmetric and intact bilaterally. No cyanosis or clubbing. Neurological: A&O x3, Strength is normal and symmetric bilaterally, cranial nerve II-XII are grossly intact, no focal motor deficit, sensory intact to light touch bilaterally.  Skin: Warm, dry and intact. No rashes or erythema. Psychiatric: Normal mood and affect. speech and behavior is normal. Cognition and memory are normal.   Assessment & Plan:   Please see problem-based assessment and plan.

## 2012-11-24 NOTE — Assessment & Plan Note (Signed)
Well controlled, HbA1c 6.8 today. Currently taking Metformin 1000 bid, and Glipizide 5 mg qd. -Continue with current regimen. -Patient to schedule eye exam in the next couple of weeks. Given referral at last appointment, but just recently got different health insurance.

## 2012-11-25 LAB — MICROALBUMIN / CREATININE URINE RATIO: Microalb Creat Ratio: 3.2 mg/g (ref 0.0–30.0)

## 2012-11-27 NOTE — Progress Notes (Signed)
I saw and evaluated the patient.  I personally confirmed the key portions of the history and exam documented by Dr. Jones and I reviewed pertinent patient test results.  The assessment, diagnosis, and plan were formulated together and I agree with the documentation in the resident's note.   

## 2012-12-24 ENCOUNTER — Other Ambulatory Visit: Payer: Self-pay | Admitting: *Deleted

## 2012-12-25 ENCOUNTER — Other Ambulatory Visit: Payer: Self-pay | Admitting: Internal Medicine

## 2012-12-26 MED ORDER — TRAMADOL HCL 50 MG PO TABS: 50.0000 mg | ORAL_TABLET | Freq: Two times a day (BID) | ORAL | Status: DC | PRN

## 2012-12-26 NOTE — Telephone Encounter (Signed)
Tramadol rx called to [facility]. 

## 2012-12-30 NOTE — Telephone Encounter (Signed)
Called to pharm 

## 2012-12-30 NOTE — Telephone Encounter (Signed)
i called the pharm, they had filled it from a call from glenda

## 2013-01-13 ENCOUNTER — Encounter: Payer: Self-pay | Admitting: Internal Medicine

## 2013-01-13 ENCOUNTER — Encounter: Payer: BC Managed Care – PPO | Admitting: Internal Medicine

## 2013-01-16 ENCOUNTER — Other Ambulatory Visit: Payer: Self-pay | Admitting: *Deleted

## 2013-01-16 MED ORDER — TRAMADOL HCL 50 MG PO TABS: 50.0000 mg | ORAL_TABLET | Freq: Two times a day (BID) | ORAL | Status: DC | PRN

## 2013-01-16 NOTE — Telephone Encounter (Signed)
Called to pharmacy 

## 2013-03-25 ENCOUNTER — Other Ambulatory Visit: Payer: Self-pay | Admitting: *Deleted

## 2013-03-26 MED ORDER — TRAMADOL HCL 50 MG PO TABS: 50.0000 mg | ORAL_TABLET | Freq: Two times a day (BID) | ORAL | Status: DC | PRN

## 2013-03-27 NOTE — Telephone Encounter (Signed)
Called to pharm 

## 2013-05-03 DEATH — deceased

## 2013-06-19 ENCOUNTER — Encounter (HOSPITAL_COMMUNITY): Payer: Self-pay | Admitting: Emergency Medicine

## 2013-06-19 ENCOUNTER — Emergency Department (HOSPITAL_COMMUNITY): Payer: BC Managed Care – PPO

## 2013-06-19 ENCOUNTER — Emergency Department (HOSPITAL_COMMUNITY)
Admission: EM | Admit: 2013-06-19 | Discharge: 2013-06-19 | Disposition: A | Discharge status: Home / Self Care | Payer: BC Managed Care – PPO | Attending: Emergency Medicine | Admitting: Emergency Medicine

## 2013-06-19 DIAGNOSIS — R209 Unspecified disturbances of skin sensation: Secondary | ICD-10-CM | POA: Insufficient documentation

## 2013-06-19 DIAGNOSIS — R739 Hyperglycemia, unspecified: Secondary | ICD-10-CM

## 2013-06-19 DIAGNOSIS — E1136 Type 2 diabetes mellitus with diabetic cataract: Secondary | ICD-10-CM | POA: Insufficient documentation

## 2013-06-19 DIAGNOSIS — Z79899 Other long term (current) drug therapy: Secondary | ICD-10-CM | POA: Insufficient documentation

## 2013-06-19 DIAGNOSIS — R202 Paresthesia of skin: Secondary | ICD-10-CM

## 2013-06-19 DIAGNOSIS — F1410 Cocaine abuse, uncomplicated: Secondary | ICD-10-CM | POA: Insufficient documentation

## 2013-06-19 DIAGNOSIS — H538 Other visual disturbances: Secondary | ICD-10-CM | POA: Insufficient documentation

## 2013-06-19 DIAGNOSIS — E1139 Type 2 diabetes mellitus with other diabetic ophthalmic complication: Secondary | ICD-10-CM | POA: Insufficient documentation

## 2013-06-19 DIAGNOSIS — R21 Rash and other nonspecific skin eruption: Secondary | ICD-10-CM | POA: Insufficient documentation

## 2013-06-19 DIAGNOSIS — G8929 Other chronic pain: Secondary | ICD-10-CM | POA: Insufficient documentation

## 2013-06-19 DIAGNOSIS — Z7982 Long term (current) use of aspirin: Secondary | ICD-10-CM | POA: Insufficient documentation

## 2013-06-19 DIAGNOSIS — E785 Hyperlipidemia, unspecified: Secondary | ICD-10-CM | POA: Insufficient documentation

## 2013-06-19 DIAGNOSIS — Z87891 Personal history of nicotine dependence: Secondary | ICD-10-CM | POA: Insufficient documentation

## 2013-06-19 DIAGNOSIS — Z87448 Personal history of other diseases of urinary system: Secondary | ICD-10-CM | POA: Insufficient documentation

## 2013-06-19 LAB — COMPREHENSIVE METABOLIC PANEL
ALT: 20 U/L (ref 0–53)
AST: 26 U/L (ref 0–37)
Albumin: 4.7 g/dL (ref 3.5–5.2)
Alkaline Phosphatase: 90 U/L (ref 39–117)
BUN: 10 mg/dL (ref 6–23)
CALCIUM: 10.1 mg/dL (ref 8.4–10.5)
CO2: 19 meq/L (ref 19–32)
CREATININE: 0.85 mg/dL (ref 0.50–1.35)
Chloride: 97 mEq/L (ref 96–112)
Glucose, Bld: 317 mg/dL — ABNORMAL HIGH (ref 70–99)
Potassium: 5.3 mEq/L (ref 3.7–5.3)
Sodium: 139 mEq/L (ref 137–147)
TOTAL PROTEIN: 8.6 g/dL — AB (ref 6.0–8.3)
Total Bilirubin: 0.5 mg/dL (ref 0.3–1.2)

## 2013-06-19 LAB — I-STAT CHEM 8, ED
BUN: 10 mg/dL (ref 6–23)
Calcium, Ion: 1.1 mmol/L — ABNORMAL LOW (ref 1.12–1.23)
Chloride: 101 mEq/L (ref 96–112)
Creatinine, Ser: 1.1 mg/dL (ref 0.50–1.35)
Glucose, Bld: 324 mg/dL — ABNORMAL HIGH (ref 70–99)
HEMATOCRIT: 49 % (ref 39.0–52.0)
Hemoglobin: 16.7 g/dL (ref 13.0–17.0)
Potassium: 4.3 mEq/L (ref 3.7–5.3)
Sodium: 138 mEq/L (ref 137–147)
TCO2: 22 mmol/L (ref 0–100)

## 2013-06-19 LAB — URINE MICROSCOPIC-ADD ON

## 2013-06-19 LAB — PROTIME-INR
INR: 0.98 (ref 0.00–1.49)
Prothrombin Time: 12.8 seconds (ref 11.6–15.2)

## 2013-06-19 LAB — CBC
HEMATOCRIT: 44.8 % (ref 39.0–52.0)
Hemoglobin: 15.1 g/dL (ref 13.0–17.0)
MCH: 30.7 pg (ref 26.0–34.0)
MCHC: 33.7 g/dL (ref 30.0–36.0)
MCV: 91.1 fL (ref 78.0–100.0)
Platelets: 196 10*3/uL (ref 150–400)
RBC: 4.92 MIL/uL (ref 4.22–5.81)
RDW: 13.1 % (ref 11.5–15.5)
WBC: 6.3 10*3/uL (ref 4.0–10.5)

## 2013-06-19 LAB — RAPID URINE DRUG SCREEN, HOSP PERFORMED
Amphetamines: NOT DETECTED
BENZODIAZEPINES: NOT DETECTED
Barbiturates: NOT DETECTED
COCAINE: POSITIVE — AB
Opiates: NOT DETECTED
Tetrahydrocannabinol: NOT DETECTED

## 2013-06-19 LAB — URINALYSIS, ROUTINE W REFLEX MICROSCOPIC
BILIRUBIN URINE: NEGATIVE
Hgb urine dipstick: NEGATIVE
KETONES UR: 15 mg/dL — AB
Leukocytes, UA: NEGATIVE
Nitrite: NEGATIVE
Protein, ur: NEGATIVE mg/dL
Specific Gravity, Urine: 1.035 — ABNORMAL HIGH (ref 1.005–1.030)
Urobilinogen, UA: 0.2 mg/dL (ref 0.0–1.0)
pH: 5 (ref 5.0–8.0)

## 2013-06-19 LAB — ETHANOL: Alcohol, Ethyl (B): 152 mg/dL — ABNORMAL HIGH (ref 0–11)

## 2013-06-19 LAB — I-STAT TROPONIN, ED: Troponin i, poc: 0 ng/mL (ref 0.00–0.08)

## 2013-06-19 LAB — CBG MONITORING, ED
GLUCOSE-CAPILLARY: 176 mg/dL — AB (ref 70–99)
Glucose-Capillary: 322 mg/dL — ABNORMAL HIGH (ref 70–99)

## 2013-06-19 LAB — DIFFERENTIAL
Basophils Absolute: 0 10*3/uL (ref 0.0–0.1)
Basophils Relative: 1 % (ref 0–1)
EOS PCT: 0 % (ref 0–5)
Eosinophils Absolute: 0 10*3/uL (ref 0.0–0.7)
LYMPHS PCT: 21 % (ref 12–46)
Lymphs Abs: 1.3 10*3/uL (ref 0.7–4.0)
MONO ABS: 0.4 10*3/uL (ref 0.1–1.0)
Monocytes Relative: 6 % (ref 3–12)
Neutro Abs: 4.6 10*3/uL (ref 1.7–7.7)
Neutrophils Relative %: 72 % (ref 43–77)

## 2013-06-19 LAB — APTT: aPTT: 26 seconds (ref 24–37)

## 2013-06-19 MED ORDER — CLOTRIMAZOLE-BETAMETHASONE 1-0.05 % EX CREA: 1.0000 "application " | TOPICAL_CREAM | Freq: Two times a day (BID) | CUTANEOUS | Status: DC

## 2013-06-19 NOTE — ED Notes (Addendum)
Pt reports sudden onset blurry vision and numbness to fingers of R hand and toes of R foot.  Pt is diabetic.  Speech clear.  No facial droop.

## 2013-06-19 NOTE — ED Provider Notes (Signed)
CSN: [id number]     Arrival date & time 06/19/13  [id number] History   First MD Initiated Contact with Patient 06/19/13 [phone]     Chief Complaint  Patient presents with  . Blurred Vision  . Numbness     (Consider location/radiation/quality/duration/timing/severity/associated sxs/prior Treatment) The history is provided by the patient.   Jacob Durham is a 53 y.o. male is here for evaluation of bilateral blurry vision. The bilateral blurry vision started today, 60 minutes ago, while he was cooking, over a hot grill. For the last year, he has had right eye blurred vision, daily. He is also had tingling in the right hand, and right foot for one year. That is constant. He is taking his medications, as prescribed. He denies nausea, vomiting, chest pain, shortness of breath, or weakness. He came here by private vehicle.   Past Medical History  Diagnosis Date  . Diabetes mellitus   . Diabetic cataract(366.41)   . Erectile dysfunction   . Hyperlipidemia   . Chronic back pain    Past Surgical History  Procedure Laterality Date  . Cataract extraction  2007   Family History  Problem Relation Age of Onset  . Diabetes Maternal Grandmother   . Hypertension Maternal Grandmother    History  Substance Use Topics  . Smoking status: Former Smoker -- 0.25 packs/day for 5 years    Types: Cigarettes    Quit date: 03/07/2001  . Smokeless tobacco: Not on file  . Alcohol Use: 8.4 oz/week    14 Cans of beer per week    Review of Systems  All other systems reviewed and are negative.     Allergies  Review of patient's allergies indicates no known allergies.  Home Medications   Prior to Admission medications   Medication Sig Start Date End Date Taking? Authorizing Provider  aspirin 81 MG EC tablet Take 1 tablet (81 mg total) by mouth daily. With meal 11/06/11   Bronson Curbarolyn Ziemer, MD  atorvastatin (LIPITOR) 20 MG tablet Take 1 tablet (20 mg total) by mouth daily. 08/11/12 08/11/13  Bronson Curbarolyn Ziemer, MD   gabapentin (NEURONTIN) 100 MG capsule Take 1 capsule (100 mg total) by mouth 3 (three) times daily. 11/24/12 11/24/13  Courtney ParisEden W Jones, MD  glipiZIDE (GLUCOTROL XL) 5 MG 24 hr tablet TAKE 1 TABLET (5 MG TOTAL) BY MOUTH DAILY. 06/28/12   Bronson Curbarolyn Ziemer, MD  ibuprofen (ADVIL,MOTRIN) 200 MG tablet Take 200-400 mg by mouth every 6 (six) hours as needed. For pain.     Historical Provider, MD  Lancets MISC Use to test blood sugar every other morning before eating 11/06/11   Bronson Curbarolyn Ziemer, MD  metFORMIN (GLUCOPHAGE) 1000 MG tablet Take 1 tablet (1,000 mg total) by mouth 2 (two) times daily with a meal. 08/11/12   Bronson Curbarolyn Ziemer, MD  Omega-3 Fatty Acids (FISH OIL) 1000 MG CAPS Take 1 capsule by mouth 2 (two) times daily.      Historical Provider, MD  sildenafil (VIAGRA) 100 MG tablet Take 1 tablet (100 mg total) by mouth as needed for erectile dysfunction. 45 minutes before needed. Do not take more than one tablet per day. 11/06/11   Bronson Curbarolyn Ziemer, MD  traMADol (ULTRAM) 50 MG tablet TAKE 1 TABLET (50 MG TOTAL) BY MOUTH 2 (TWO) TIMES DAILY AS NEEDED FOR PAIN. 12/25/12   Courtney ParisEden W Jones, MD  traMADol (ULTRAM) 50 MG tablet Take 1 tablet (50 mg total) by mouth 2 (two) times daily as needed. 03/25/13   Courtney ParisEden W Jones,  MD   BP 146/79  Pulse 84  Temp(Src) 98.1 F (36.7 C) (Oral)  Resp 23  SpO2 98% Physical Exam  Nursing note and vitals reviewed. Constitutional: He is oriented to person, place, and time. He appears well-developed and well-nourished.  HENT:  Head: Normocephalic and atraumatic.  Right Ear: External ear normal.  Left Ear: External ear normal.  Eyes: Conjunctivae and EOM are normal. Pupils are equal, round, and reactive to light.  Neck: Normal range of motion and phonation normal. Neck supple.  Cardiovascular: Normal rate, regular rhythm, normal heart sounds and intact distal pulses.   Pulmonary/Chest: Effort normal and breath sounds normal. He exhibits no bony tenderness.  Abdominal: Soft. There is no  tenderness.  Musculoskeletal: Normal range of motion.  Neurological: He is alert and oriented to person, place, and time. No cranial nerve deficit or sensory deficit. He exhibits normal muscle tone. Coordination normal.  No dysarthria, aphasia or nystagmus. Normal finger to nose and heel to shin, bilaterally. No visual field cut.  Skin: Skin is warm, dry and intact.  Psychiatric: He has a normal mood and affect. His behavior is normal. Judgment and thought content normal.    ED Course  Procedures (including critical care time) Labs Review  Medications - No data to display  Patient Vitals for the past 24 hrs:  BP Temp Temp src Pulse Resp SpO2  06/19/13 1442 146/79 mmHg - - - 23 98 %  06/19/13 1430 136/87 mmHg - Oral - 18 97 %  06/19/13 1400 121/72 mmHg - - 84 16 97 %  06/19/13 1342 121/70 mmHg - - - 14 99 %  06/19/13 1300 128/75 mmHg - - 89 19 97 %  06/19/13 1230 112/81 mmHg - - 92 16 96 %  06/19/13 1212 117/65 mmHg - - 90 - 96 %  06/19/13 1200 129/82 mmHg - - 95 17 97 %  06/19/13 1130 124/81 mmHg - - 93 14 97 %  06/19/13 1113 126/72 mmHg - - - 17 98 %  06/19/13 1031 135/66 mmHg - - 106 20 96 %  06/19/13 1017 - 98.1 F (36.7 C) - - - -  06/19/13 0930 151/74 mmHg 98.1 F (36.7 C) Oral 111 19 95 %   Reevaluation at discharge- he states his numbness and blurred vision had completely resolved. He had no other complaints.   Labs Reviewed  ETHANOL - Abnormal; Notable for the following:    Alcohol, Ethyl (B) 152 (*)    All other components within normal limits  COMPREHENSIVE METABOLIC PANEL - Abnormal; Notable for the following:    Glucose, Bld 317 (*)    Total Protein 8.6 (*)    All other components within normal limits  URINE RAPID DRUG SCREEN (HOSP PERFORMED) - Abnormal; Notable for the following:    Cocaine POSITIVE (*)    All other components within normal limits  URINALYSIS, ROUTINE W REFLEX MICROSCOPIC - Abnormal; Notable for the following:    Specific Gravity, Urine  1.035 (*)    Glucose, UA >1000 (*)    Ketones, ur 15 (*)    All other components within normal limits  CBG MONITORING, ED - Abnormal; Notable for the following:    Glucose-Capillary 322 (*)    All other components within normal limits  I-STAT CHEM 8, ED - Abnormal; Notable for the following:    Glucose, Bld 324 (*)    Calcium, Ion 1.10 (*)    All other components within normal limits  CBG MONITORING, ED -  Abnormal; Notable for the following:    Glucose-Capillary 176 (*)    All other components within normal limits  PROTIME-INR  APTT  CBC  DIFFERENTIAL  URINE MICROSCOPIC-ADD ON  I-STAT TROPOININ, ED  I-STAT TROPOININ, ED    Imaging Review Ct Head Wo Contrast  06/19/2013   CLINICAL DATA:  Numbness of the fingers and toes on the right has onset of blurred vision ; history of diabetes  EXAM: CT HEAD WITHOUT CONTRAST  TECHNIQUE: Contiguous axial images were obtained from the base of the skull through the vertex without intravenous contrast.  COMPARISON:  None.  FINDINGS: The ventricles are normal in size and position. There is no intracranial hemorrhage nor intracranial mass effect. There is no evidence of an evolving ischemic infarction. There are no abnormal intracranial calcifications. The cerebellum and brainstem are normal in density.  At bone window settings the observed portions of the paranasal sinuses and mastoid air cells are clear. There is no evidence of an acute skull fracture.  IMPRESSION: 1. There is no evidence of an acute intracranial hemorrhage nor of an evolving ischemic infarction. 2. There is no intracranial mass effect or hydrocephalus. 3. There is no evidence of acute inflammation of the visualized portions of the paranasal sinuses or mastoid sinuses.   Electronically Signed   By: Jarrah  [place]   On: 06/19/2013 11:03     EKG Interpretation   Date/Time:  Friday June 19 2013 09:42:01 EDT Ventricular Rate:  112 PR Interval:  120 QRS Duration: 79 QT Interval:   319 QTC Calculation: 435 R Axis:   84 Text Interpretation:  Sinus tachycardia No old tracing to compare  Confirmed by [facility]  MD, Kristyana Notte [phone]) on 06/19/2013 10:29:12 AM      MDM   Final diagnoses:  Paresthesia  Blurred vision  Hyperglycemia  Cocaine abuse  Rash    Nursing Notes Reviewed/ Care Coordinated, and agree without changes. Applicable Imaging Reviewed.  Interpretation of Laboratory Data incorporated into ED treatment\   Nursing Notes Reviewed/ Care Coordinated Applicable Imaging Reviewed Interpretation of Laboratory Data incorporated into ED treatment  The patient appears reasonably screened and/or stabilized for discharge and I doubt any other medical condition or other [facility] requiring further screening, evaluation, or treatment in the ED at this time prior to discharge.  Plan: Home Medications- usual; Home Treatments- Rest, Stop Cocaine; return here if the recommended treatment, does not improve the symptoms; Recommended follow up- PCP 1 week for check up    Flint Melter, MD 06/19/13 [phone]

## 2013-06-19 NOTE — ED Notes (Signed)
Dr. Effie Shywentz updated on CT results and pt request to speak to pt.

## 2013-06-19 NOTE — ED Notes (Signed)
Dr. Effie Shywentz informed pt would like to speak to them sts will review chart and see patient.

## 2013-06-19 NOTE — Discharge Instructions (Signed)
Get plenty of rest, and drink a lot of fluids. Take your prescription medicine as directed. Your urine drug screen today showed cocaine. This can cause paresthesias, and blurred vision.   Hyperglycemia Hyperglycemia occurs when the glucose (sugar) in your blood is too high. Hyperglycemia can happen for many reasons, but it most often happens to people who do not know they have diabetes or are not managing their diabetes properly.  CAUSES  Whether you have diabetes or not, there are other causes of hyperglycemia. Hyperglycemia can occur when you have diabetes, but it can also occur in other situations that you might not be as aware of, such as: Diabetes  If you have diabetes and are having problems controlling your blood glucose, hyperglycemia could occur because of some of the following reasons:  Not following your meal plan.  Not taking your diabetes medications or not taking it properly.  Exercising less or doing less activity than you normally do.  Being sick. Pre-diabetes  This cannot be ignored. Before people develop Type 2 diabetes, they almost always have "pre-diabetes." This is when your blood glucose levels are higher than normal, but not yet high enough to be diagnosed as diabetes. Research has shown that some long-term damage to the body, especially the heart and circulatory system, may already be occurring during pre-diabetes. If you take action to manage your blood glucose when you have pre-diabetes, you may delay or prevent Type 2 diabetes from developing. Stress  If you have diabetes, you may be "diet" controlled or on oral medications or insulin to control your diabetes. However, you may find that your blood glucose is higher than usual in the hospital whether you have diabetes or not. This is often referred to as "stress hyperglycemia." Stress can elevate your blood glucose. This happens because of hormones put out by the body during times of stress. If stress has been the  cause of your high blood glucose, it can be followed regularly by your caregiver. That way he/she can make sure your hyperglycemia does not continue to get worse or progress to diabetes. Steroids  Steroids are medications that act on the infection fighting system (immune system) to block inflammation or infection. One side effect can be a rise in blood glucose. Most people can produce enough extra insulin to allow for this rise, but for those who cannot, steroids make blood glucose levels go even higher. It is not unusual for steroid treatments to "uncover" diabetes that is developing. It is not always possible to determine if the hyperglycemia will go away after the steroids are stopped. A special blood test called an A1c is sometimes done to determine if your blood glucose was elevated before the steroids were started. SYMPTOMS  Thirsty.  Frequent urination.  Dry mouth.  Blurred vision.  Tired or fatigue.  Weakness.  Sleepy.  Tingling in feet or leg. DIAGNOSIS  Diagnosis is made by monitoring blood glucose in one or all of the following ways:  A1c test. This is a chemical found in your blood.  Fingerstick blood glucose monitoring.  Laboratory results. TREATMENT  First, knowing the cause of the hyperglycemia is important before the hyperglycemia can be treated. Treatment may include, but is not be limited to:  Education.  Change or adjustment in medications.  Change or adjustment in meal plan.  Treatment for an illness, infection, etc.  More frequent blood glucose monitoring.  Change in exercise plan.  Decreasing or stopping steroids.  Lifestyle changes. HOME CARE INSTRUCTIONS  Test your blood glucose as directed.  Exercise regularly. Your caregiver will give you instructions about exercise. Pre-diabetes or diabetes which comes on with stress is helped by exercising.  Eat wholesome, balanced meals. Eat often and at regular, fixed times. Your caregiver or  nutritionist will give you a meal plan to guide your sugar intake.  Being at an ideal weight is important. If needed, losing as little as 10 to 15 pounds may help improve blood glucose levels. SEEK MEDICAL CARE IF:   You have questions about medicine, activity, or diet.  You continue to have symptoms (problems such as increased thirst, urination, or weight gain). SEEK IMMEDIATE MEDICAL CARE IF:   You are vomiting or have diarrhea.  Your breath smells fruity.  You are breathing faster or slower.  You are very sleepy or incoherent.  You have numbness, tingling, or pain in your feet or hands.  You have chest pain.  Your symptoms get worse even though you have been following your caregiver's orders.  If you have any other questions or concerns. Document Released: 08/15/2000 Document Revised: 05/14/2011 Document Reviewed: 06/18/2011 [facility] Patient Information 2014 [place], [place].  Rash A rash is a change in the color or texture of your skin. There are many different types of rashes. You may have other problems that accompany your rash. CAUSES   Infections.  Allergic reactions. This can include allergies to pets or foods.  Certain medicines.  Exposure to certain chemicals, soaps, or cosmetics.  Heat.  Exposure to poisonous plants.  Tumors, both cancerous and noncancerous. SYMPTOMS   Redness.  Scaly skin.  Itchy skin.  Dry or cracked skin.  Bumps.  Blisters.  Pain. DIAGNOSIS  Your caregiver may do a physical exam to determine what type of rash you have. A skin sample (biopsy) may be taken and examined under a microscope. TREATMENT  Treatment depends on the type of rash you have. Your caregiver may prescribe certain medicines. For serious conditions, you may need to see a skin doctor (dermatologist). HOME CARE INSTRUCTIONS   Avoid the substance that caused your rash.  Do not scratch your rash. This can cause infection.  You may take cool baths to help  stop itching.  Only take over-the-counter or prescription medicines as directed by your caregiver.  Keep all follow-up appointments as directed by your caregiver. SEEK IMMEDIATE MEDICAL CARE IF:  You have increasing pain, swelling, or redness.  You have a fever.  You have new or severe symptoms.  You have body aches, diarrhea, or vomiting.  Your rash is not better after 3 days. MAKE SURE YOU:  Understand these instructions.  Will watch your condition.  Will get help right away if you are not doing well or get worse. Document Released: 02/09/2002 Document Revised: 05/14/2011 Document Reviewed: 12/04/2010 [facility] Patient Information 2014 [place], [place].  Paresthesia Paresthesia is a burning or prickling feeling. This feeling can happen in any part of the body. It often happens in the hands, arms, legs, or feet. HOME CARE  Avoid drinking alcohol.  Try massage or needle therapy (acupuncture) to help with your problems.  Keep all doctor visits as told. GET HELP RIGHT AWAY IF:   You feel weak.  You have trouble walking or moving.  You have problems speaking or seeing.  You feel confused.  You cannot control when you poop (bowel movement) or pee (urinate).  You lose feeling (numbness) after an injury.  You pass out (faint).  Your burning or prickling feeling gets worse  when you walk.  You have pain, cramps, or feel dizzy.  You have a rash. MAKE SURE YOU:   Understand these instructions.  Will watch your condition.  Will get help right away if you are not doing well or get worse. Document Released: 02/02/2008 Document Revised: 05/14/2011 Document Reviewed: 11/10/2010 [facility] Patient Information 2014 [place].  Substance Abuse Your exam indicates that you have a problem with substance abuse. Substance abuse is the misuse of alcohol or drugs that causes problems in family life, friendships, and work relationships. Substance abuse is the most  important cause of premature illness, disability, and death in our society. It is also the greatest threat to a person's mental and spiritual well being. Substance abuse can start out in an innocent way, such as social drinking or taking a little extra medication prescribed by your doctor. No one starts out with the intention of becoming an alcoholic or an addict. Substance abuse victims cannot control their use of alcohol or drugs. They may become intoxicated daily or go on weekend binges. Often there is a strong desire to quit, but attempts to stop using often fail. Encounters with law enforcement or conflicts with family members, friends, and work associates are signs of a potential problem. Recovery is always possible, although the craving for some drugs makes it difficult to quit without assistance. Many treatment programs are available to help people stop abusing alcohol or drugs. The first step in treatment is to admit you have a problem. This is a major hurdle because denial is a powerful force with substance abuse. Alcoholics Anonymous, Narcotics Anonymous, Cocaine Anonymous, and other recovery groups and programs can be very useful in helping people to quit. If you do not feel okay about your drug or alcohol use and if it is causing you trouble, we want to encourage you to talk about it with your doctor or with someone from a recovery group who can help you. You could also call the [employer] [facility] [employer] at 1-800-662-HELP. It is up to you to take the first step. [facility]ON and ALA-TEEN are support groups for friends and family members of an alcohol or drug dependent person. The people who love and care for the alcoholic or addicted person often need help, too. For information about these organizations, check your phone directory or call a local alcohol or drug treatment center. Document Released: 03/29/2004 Document Revised: 05/14/2011 Document Reviewed: 02/21/2008 [facility] Patient  Information 2014 [place], [place].

## 2013-06-19 NOTE — ED Notes (Signed)
Dr wentz at bedside 

## 2013-07-20 ENCOUNTER — Other Ambulatory Visit: Payer: Self-pay | Admitting: *Deleted

## 2013-07-20 DIAGNOSIS — E119 Type 2 diabetes mellitus without complications: Secondary | ICD-10-CM

## 2013-07-21 MED ORDER — GLIPIZIDE ER 5 MG PO TB24: ORAL_TABLET | ORAL | Status: DC

## 2013-07-21 MED ORDER — TRAMADOL HCL 50 MG PO TABS: 50.0000 mg | ORAL_TABLET | Freq: Two times a day (BID) | ORAL | Status: DC | PRN

## 2013-07-21 MED ORDER — METFORMIN HCL 1000 MG PO TABS: 1000.0000 mg | ORAL_TABLET | Freq: Two times a day (BID) | ORAL | Status: DC

## 2013-07-22 NOTE — Telephone Encounter (Signed)
Rx called in 

## 2013-09-16 ENCOUNTER — Other Ambulatory Visit: Payer: Self-pay | Admitting: *Deleted

## 2013-09-17 MED ORDER — TRAMADOL HCL 50 MG PO TABS: 50.0000 mg | ORAL_TABLET | Freq: Two times a day (BID) | ORAL | Status: DC | PRN

## 2013-09-17 NOTE — Telephone Encounter (Signed)
Called to HT. 

## 2013-10-15 ENCOUNTER — Other Ambulatory Visit: Payer: Self-pay | Admitting: Internal Medicine

## 2013-10-26 ENCOUNTER — Telehealth: Payer: Self-pay | Admitting: Internal Medicine

## 2013-10-26 ENCOUNTER — Encounter: Payer: Self-pay | Admitting: Internal Medicine

## 2013-10-26 NOTE — Telephone Encounter (Signed)
Attempted to contact patient this morning to get him a diabetes checkup appt, but the telephone number on file [phone] has been disconnected.  Sending letter in the mail asking patient to contact us for an appt and to update his phone number.

## 2014-02-10 ENCOUNTER — Telehealth: Payer: Self-pay | Admitting: Dietician

## 2014-02-16 NOTE — Telephone Encounter (Signed)
Was told by person answering the phone that current number listed is incorrect.

## 2014-12-31 ENCOUNTER — Encounter (HOSPITAL_COMMUNITY): Payer: Self-pay

## 2014-12-31 ENCOUNTER — Emergency Department (HOSPITAL_COMMUNITY)
Admission: EM | Admit: 2014-12-31 | Discharge: 2014-12-31 | Disposition: A | Discharge status: Home / Self Care | Payer: Self-pay | Attending: Emergency Medicine | Admitting: Emergency Medicine

## 2014-12-31 DIAGNOSIS — Z87438 Personal history of other diseases of male genital organs: Secondary | ICD-10-CM | POA: Insufficient documentation

## 2014-12-31 DIAGNOSIS — E785 Hyperlipidemia, unspecified: Secondary | ICD-10-CM | POA: Insufficient documentation

## 2014-12-31 DIAGNOSIS — E119 Type 2 diabetes mellitus without complications: Secondary | ICD-10-CM | POA: Insufficient documentation

## 2014-12-31 DIAGNOSIS — Z7982 Long term (current) use of aspirin: Secondary | ICD-10-CM | POA: Insufficient documentation

## 2014-12-31 DIAGNOSIS — Z87891 Personal history of nicotine dependence: Secondary | ICD-10-CM | POA: Insufficient documentation

## 2014-12-31 DIAGNOSIS — L509 Urticaria, unspecified: Secondary | ICD-10-CM

## 2014-12-31 DIAGNOSIS — Z79899 Other long term (current) drug therapy: Secondary | ICD-10-CM | POA: Insufficient documentation

## 2014-12-31 DIAGNOSIS — Z7952 Long term (current) use of systemic steroids: Secondary | ICD-10-CM | POA: Insufficient documentation

## 2014-12-31 DIAGNOSIS — R1310 Dysphagia, unspecified: Secondary | ICD-10-CM | POA: Insufficient documentation

## 2014-12-31 DIAGNOSIS — G8929 Other chronic pain: Secondary | ICD-10-CM | POA: Insufficient documentation

## 2014-12-31 MED ORDER — DEXAMETHASONE SODIUM PHOSPHATE 10 MG/ML IJ SOLN
10.0000 mg | Freq: Once | INTRAMUSCULAR | Status: AC
  Administered 2014-12-31: 10 mg via INTRAMUSCULAR
  Filled 2014-12-31: qty 1

## 2014-12-31 MED ORDER — PREDNISONE 20 MG PO TABS: 40.0000 mg | ORAL_TABLET | Freq: Every day | ORAL | Status: DC

## 2014-12-31 MED ORDER — HYDROXYZINE HCL 25 MG PO TABS: 25.0000 mg | ORAL_TABLET | Freq: Four times a day (QID) | ORAL | Status: DC

## 2014-12-31 MED ORDER — TRIAMCINOLONE ACETONIDE 0.1 % EX CREA: 1.0000 "application " | TOPICAL_CREAM | Freq: Four times a day (QID) | CUTANEOUS | Status: DC | PRN

## 2014-12-31 NOTE — Discharge Instructions (Signed)
Please discontinue using the prednisone if her sugar becomes extremely elevated. The cream I have prescribed should not be applied to the face, neck or head. The Atarax may make you very sleepy. Do not drive or operate heavy machinery while taking this medication.  Hives Hives are itchy, red, swollen areas of the skin. They can vary in size and location on your body. Hives can come and go for hours or several days (acute hives) or for several weeks (chronic hives). Hives do not spread from person to person (noncontagious). They may get worse with scratching, exercise, and emotional stress. CAUSES   Allergic reaction to food, additives, or drugs.  Infections, including the common cold.  Illness, such as vasculitis, lupus, or thyroid disease.  Exposure to sunlight, heat, or cold.  Exercise.  Stress.  Contact with chemicals. SYMPTOMS   Red or white swollen patches on the skin. The patches may change size, shape, and location quickly and repeatedly.  Itching.  Swelling of the hands, feet, and face. This may occur if hives develop deeper in the skin. DIAGNOSIS  Your caregiver can usually tell what is wrong by performing a physical exam. Skin or blood tests may also be done to determine the cause of your hives. In some cases, the cause cannot be determined. TREATMENT  Mild cases usually get better with medicines such as antihistamines. Severe cases may require an emergency epinephrine injection. If the cause of your hives is known, treatment includes avoiding that trigger.  HOME CARE INSTRUCTIONS   Avoid causes that trigger your hives.  Take antihistamines as directed by your caregiver to reduce the severity of your hives. Non-sedating or low-sedating antihistamines are usually recommended. Do not drive while taking an antihistamine.  Take any other medicines prescribed for itching as directed by your caregiver.  Wear loose-fitting clothing.  Keep all follow-up appointments as  directed by your caregiver. SEEK MEDICAL CARE IF:   You have persistent or severe itching that is not relieved with medicine.  You have painful or swollen joints. SEEK IMMEDIATE MEDICAL CARE IF:   You have a fever.  Your tongue or lips are swollen.  You have trouble breathing or swallowing.  You feel tightness in the throat or chest.  You have abdominal pain. These problems may be the first sign of a life-threatening allergic reaction. Call your local emergency services (911 in [place] MAKE SURE YOU:   Understand these instructions.  Will watch your condition.  Will get help right away if you are not doing well or get worse.   This information is not intended to replace advice given to you by your health care provider. Make sure you discuss any questions you have with your health care provider.   Document Released: 02/19/2005 Document Revised: 02/24/2013 Document Reviewed: 05/15/2011 [employer] Interactive Patient Education [employer]

## 2014-12-31 NOTE — ED Provider Notes (Signed)
CSN: [id number]     Arrival date & time 12/31/14  1612 History   By signing my name below, I, Gonzella Lex, attest that this documentation has been prepared under the direction and in the presence of Arthor Captain, PA-C Electronically Signed: Gonzella Lex, Scribe. 12/31/2014. 5:41 PM.  Chief Complaint  Patient presents with  . Rash   The history is provided by the patient and the spouse. No language interpreter was used.   HPI Comments: Jacob Durham is a 54 y.o. male who presents to the Emergency Department complaining of an itchy rash on BUE, onset 7 days ago. Pt notes rash to bilateral lower extremities, lower abdomen, and one spot on the top of his head. Pt reports associated difficulty swallowing today: states it feels like he has a knot in his throat. Pt's wife denies change in detergents or lotions. Pt denies change in medications, difficulty breathing, and exposure to others with similar rash. Pt has a history of DM but reports well controlled sugars in 87s and 90s recently.   Past Medical History  Diagnosis Date  . Diabetes mellitus   . Diabetic cataract(366.41)   . Erectile dysfunction   . Hyperlipidemia   . Chronic back pain    Past Surgical History  Procedure Laterality Date  . Cataract extraction  2007   Family History  Problem Relation Age of Onset  . Diabetes Maternal Grandmother   . Hypertension Maternal Grandmother    Social History  Substance Use Topics  . Smoking status: Former Smoker -- 0.25 packs/day for 5 years    Types: Cigarettes    Quit date: 03/07/2001  . Smokeless tobacco: None  . Alcohol Use: 8.4 oz/week    14 Cans of beer per week    Review of Systems  HENT: Positive for trouble swallowing.   Respiratory: Negative for shortness of breath and wheezing.   Skin: Positive for rash (generalized).   Allergies  Review of patient's allergies indicates no known allergies.  Home Medications   Prior to Admission medications    Medication Sig Start Date End Date Taking? Authorizing Provider  aspirin 81 MG EC tablet Take 1 tablet (81 mg total) by mouth daily. With meal 11/06/11   Ignacia Palma, MD  atorvastatin (LIPITOR) 20 MG tablet Take 1 tablet (20 mg total) by mouth daily. 08/11/12 08/11/13  Ignacia Palma, MD  clotrimazole-betamethasone (LOTRISONE) cream Apply 1 application topically 2 (two) times daily. For the rash on your foot 06/19/13   Mancel Bale, MD  glipiZIDE (GLUCOTROL XL) 5 MG 24 hr tablet TAKE 1 TABLET (5 MG TOTAL) BY MOUTH DAILY. 07/21/13   Courtney Paris, MD  ibuprofen (ADVIL,MOTRIN) 200 MG tablet Take 200-400 mg by mouth every 6 (six) hours as needed. For pain.     Historical Provider, MD  Lancets MISC Use to test blood sugar every other morning before eating 11/06/11   Ignacia Palma, MD  metFORMIN (GLUCOPHAGE) 1000 MG tablet Take 1 tablet (1,000 mg total) by mouth 2 (two) times daily with a meal.    Courtney Paris, MD  Omega-3 Fatty Acids (FISH OIL) 1000 MG CAPS Take 1 capsule by mouth 2 (two) times daily.      Historical Provider, MD  traMADol (ULTRAM) 50 MG tablet TAKE 1 TABLET BY MOUTH TWICE DAILY AS NEEDED 10/16/13   Courtney Paris, MD   BP 154/81 mmHg  Pulse 92  Temp(Src) 98 F (36.7 C) (Oral)  Resp 20  Ht 5' 6.5" (1.689 m)  Wt 201 lb (91.173 kg)  BMI 31.96 kg/m2  SpO2 96% Physical Exam  Constitutional: He is oriented to person, place, and time. He appears well-developed and well-nourished. No distress.  HENT:  Head: Normocephalic.  Mouth/Throat: Oropharynx is clear and moist.  Uvula midline no swelling   Eyes: Conjunctivae are normal.  Cardiovascular: Normal rate, regular rhythm and normal heart sounds.   Pulmonary/Chest: Effort normal and breath sounds normal.  Abdominal: He exhibits no distension.  Neurological: He is alert and oriented to person, place, and time.  Skin: Skin is warm and dry.  Multiple singular and confluent erythematous wheals in  varying stages Multiple areas of  crusting and excoriations Hives are generalized but worse on hands and forearms   Psychiatric: He has a normal mood and affect.  Nursing note and vitals reviewed.   ED Course  Procedures  DIAGNOSTIC STUDIES:    Oxygen Saturation is 96% on room air, adquate by my interpretation.   COORDINATION OF CARE:  5:40 PM Will order Prednisone and shot of Decadron.  Discussed treatment plan with pt at bedside and pt agreed to plan.   Labs Review Labs Reviewed - No data to display  Imaging Review No results found.    EKG Interpretation None      MDM   Final diagnoses:  Hives of unknown origin      Patient with hives. He is diabetic. Feel he needs around the prednisone. I discussed this with the attending physician, Dr. Littie Deedsgentry, who agrees with plan of care. Patient will receive Decadron here. I have given him a short course of prednisone to take by mouth. Discussed that his sugars will increase and that if they become greater than 300. He should discontinue use of the prednisone. Otherwise, Atarax, triamcinolone cream. Follow-up with primary care physician as soon as possible. As a follow-up appointment next Tuesday in 5 days. He is safe for discharge at this time. No signs of anaphylaxis.  I personally performed the services described in this documentation, which was scribed in my presence. The recorded information has been reviewed and is accurate.      Arthor CaptainAbigail Chaquita Basques, PA-C 01/01/15 0106  Mirian MoMatthew Gentry, MD 01/06/15 [phone]

## 2014-12-31 NOTE — ED Notes (Signed)
PA at bedside.

## 2014-12-31 NOTE — ED Notes (Addendum)
Pt. Has a rash all over his body, that is itching and red.  His hands also are swollen which began on Monday,the lt. First and this morning the Right.   Pt. Denies any change in his ADLs.  Or has not ate any new food.

## 2015-01-03 ENCOUNTER — Telehealth: Payer: Self-pay | Admitting: Pharmacist

## 2015-01-04 ENCOUNTER — Ambulatory Visit (INDEPENDENT_AMBULATORY_CARE_PROVIDER_SITE_OTHER): Payer: Self-pay | Admitting: Internal Medicine

## 2015-01-04 VITALS — BP 136/82 | HR 85 | Temp 97.7°F | Wt 212.0 lb

## 2015-01-04 DIAGNOSIS — Z23 Encounter for immunization: Secondary | ICD-10-CM

## 2015-01-04 DIAGNOSIS — L509 Urticaria, unspecified: Secondary | ICD-10-CM | POA: Insufficient documentation

## 2015-01-04 DIAGNOSIS — E119 Type 2 diabetes mellitus without complications: Secondary | ICD-10-CM

## 2015-01-04 DIAGNOSIS — E118 Type 2 diabetes mellitus with unspecified complications: Secondary | ICD-10-CM

## 2015-01-04 DIAGNOSIS — E1139 Type 2 diabetes mellitus with other diabetic ophthalmic complication: Secondary | ICD-10-CM

## 2015-01-04 DIAGNOSIS — Z79899 Other long term (current) drug therapy: Secondary | ICD-10-CM

## 2015-01-04 DIAGNOSIS — Z7984 Long term (current) use of oral hypoglycemic drugs: Secondary | ICD-10-CM

## 2015-01-04 DIAGNOSIS — Z0000 Encounter for general adult medical examination without abnormal findings: Secondary | ICD-10-CM

## 2015-01-04 DIAGNOSIS — E785 Hyperlipidemia, unspecified: Secondary | ICD-10-CM

## 2015-01-04 LAB — HM DIABETES EYE EXAM

## 2015-01-04 LAB — POCT GLYCOSYLATED HEMOGLOBIN (HGB A1C): Hemoglobin A1C: 8.6

## 2015-01-04 LAB — GLUCOSE, CAPILLARY: Glucose-Capillary: 466 mg/dL — ABNORMAL HIGH (ref 65–99)

## 2015-01-04 MED ORDER — METFORMIN HCL 1000 MG PO TABS: 1000.0000 mg | ORAL_TABLET | Freq: Two times a day (BID) | ORAL | Status: DC

## 2015-01-04 MED ORDER — ATORVASTATIN CALCIUM 20 MG PO TABS: 20.0000 mg | ORAL_TABLET | Freq: Every day | ORAL | Status: DC

## 2015-01-04 MED ORDER — HYDROXYZINE HCL 25 MG PO TABS: 25.0000 mg | ORAL_TABLET | Freq: Four times a day (QID) | ORAL | Status: DC

## 2015-01-04 MED ORDER — GLIPIZIDE ER 10 MG PO TB24: ORAL_TABLET | ORAL | Status: DC

## 2015-01-04 NOTE — Patient Instructions (Addendum)
General Instructions:  1. Please make a follow up appointment for 3 months.   2. Please take all medications as previously prescribed.  STOP taking Prednisone.   Continue to use cream for rash.   3. If you have worsening of your symptoms or new symptoms arise, please call the clinic ([phone]), or go to the ER immediately if symptoms are severe.  You have done a great job in taking all your medications. Please continue to do this.   Thank you for bringing your medicines today. This helps [place] keep you safe from mistakes.   Progress Toward Treatment Goals:  Treatment Goal 01/04/2015  Hemoglobin A1C unchanged  Prevent falls improved    Self Care Goals & Plans:  Self Care Goal 01/04/2015  Manage my medications take my medicines as prescribed; bring my medications to every visit; refill my medications on time  Monitor my health check my feet daily; keep track of my blood glucose; bring my glucose meter and log to each visit  Eat healthy foods eat foods that are low in salt; eat baked foods instead of fried foods  Be physically active find an activity I enjoy  Meeting treatment goals maintain the current self-care plan    Home Blood Glucose Monitoring 01/04/2015  Check my blood sugar 2 times a day  When to check my blood sugar before breakfast; before dinner     Care Management & Community Referrals:  Referral 01/04/2015  Referrals made for care management support none needed  Referrals made to community resources none     Hives Hives are itchy, red, swollen areas of the skin. They can vary in size and location on your body. Hives can come and go for hours or several days (acute hives) or for several weeks (chronic hives). Hives do not spread from person to person (noncontagious). They may get worse with scratching, exercise, and emotional stress. CAUSES   Allergic reaction to food, additives, or drugs.  Infections, including the common cold.  Illness, such as vasculitis,  lupus, or thyroid disease.  Exposure to sunlight, heat, or cold.  Exercise.  Stress.  Contact with chemicals. SYMPTOMS   Red or white swollen patches on the skin. The patches may change size, shape, and location quickly and repeatedly.  Itching.  Swelling of the hands, feet, and face. This may occur if hives develop deeper in the skin. DIAGNOSIS  Your caregiver can usually tell what is wrong by performing a physical exam. Skin or blood tests may also be done to determine the cause of your hives. In some cases, the cause cannot be determined. TREATMENT  Mild cases usually get better with medicines such as antihistamines. Severe cases may require an emergency epinephrine injection. If the cause of your hives is known, treatment includes avoiding that trigger.  HOME CARE INSTRUCTIONS   Avoid causes that trigger your hives.  Take antihistamines as directed by your caregiver to reduce the severity of your hives. Non-sedating or low-sedating antihistamines are usually recommended. Do not drive while taking an antihistamine.  Take any other medicines prescribed for itching as directed by your caregiver.  Wear loose-fitting clothing.  Keep all follow-up appointments as directed by your caregiver. SEEK MEDICAL CARE IF:   You have persistent or severe itching that is not relieved with medicine.  You have painful or swollen joints. SEEK IMMEDIATE MEDICAL CARE IF:   You have a fever.  Your tongue or lips are swollen.  You have trouble breathing or swallowing.  You feel  tightness in the throat or chest.  You have abdominal pain. These problems may be the first sign of a life-threatening allergic reaction. Call your local emergency services (911 in [place]  MAKE SURE YOU:   Understand these instructions.  Will watch your condition.  Will get help right away if you are not doing well or get worse.   This information is not intended to replace advice given to you by your  health care provider. Make sure you discuss any questions you have with your health care provider.   Document Released: 02/19/2005 Document Revised: 02/24/2013 Document Reviewed: 05/15/2011 Elsevier Interactive Patient Education [employer]2016 Elsevier Inc.

## 2015-01-04 NOTE — Progress Notes (Signed)
Subjective:   Patient ID: Jacob SlackDavid A Durham male   DOB: May 17, 1960 54 y.o.   MRN: [id number]  HPI: [name] is a 54 y.o. male w/ PMHx of DM type II, HLD, and chronic back pain, presents to the clinic today for a follow-up visit. Patient has not been seen since 11/2012 and states that he has recently been incarcerated for some time during that period. He states he has been doing well but has noted some significant fluctuations in his HbA1c since that time, being as high as 15 per the patient. He states he has been taking his medications every day recently but has not been doing so consistently prior to that point. He states he checks his blood sugars 2 times daily, in the AM before breakfast, and in the PM before dinner and states that they are typically ~120, sometimes as low as the 80's. He denies any significant symptoms when they get to that point. He denies recent blood sugars >160. He says he NEVER has blood sugars >200 (CBG 400's today likely 2/2 prednisone).  Patient was recently evaluated in the ED 2/2 hives at which time he was given an Rx for Prednisone 40 mg daily for 5 days and was also given a dose of Decadron in the ED. He says this has not helped much, but he only has a few areas of pruritis on his left leg and head.   Past Medical History  Diagnosis Date  . Diabetes mellitus   . Diabetic cataract(366.41)   . Erectile dysfunction   . Hyperlipidemia   . Chronic back pain    Current Outpatient Prescriptions  Medication Sig Dispense Refill  . aspirin 81 MG EC tablet Take 1 tablet (81 mg total) by mouth daily. With meal 30 tablet 11  . atorvastatin (LIPITOR) 20 MG tablet Take 1 tablet (20 mg total) by mouth daily. 30 tablet 11  . clotrimazole-betamethasone (LOTRISONE) cream Apply 1 application topically 2 (two) times daily. For the rash on your foot 30 g 0  . glipiZIDE (GLUCOTROL XL) 10 MG 24 hr tablet TAKE 1 TABLET (5 MG TOTAL) BY MOUTH DAILY. 30 tablet 5  . hydrOXYzine  (ATARAX/VISTARIL) 25 MG tablet Take 1 tablet (25 mg total) by mouth every 6 (six) hours. 30 tablet 0  . ibuprofen (ADVIL,MOTRIN) 200 MG tablet Take 200-400 mg by mouth every 6 (six) hours as needed. For pain.     . Lancets MISC Use to test blood sugar every other morning before eating 100 each 2  . metFORMIN (GLUCOPHAGE) 1000 MG tablet Take 1 tablet (1,000 mg total) by mouth 2 (two) times daily with a meal. 60 tablet 5  . Omega-3 Fatty Acids (FISH OIL) 1000 MG CAPS Take 1 capsule by mouth 2 (two) times daily.      . traMADol (ULTRAM) 50 MG tablet TAKE 1 TABLET BY MOUTH TWICE DAILY AS NEEDED 60 tablet 0  . triamcinolone cream (KENALOG) 0.1 % Apply 1 application topically 4 (four) times daily as needed. 30 g 0   No current facility-administered medications for this visit.   Review of Systems  General: Positive for pruritis. Denies fever, diaphoresis, appetite change, and fatigue.  Respiratory: Denies SOB, cough, and wheezing.   Cardiovascular: Denies chest pain and palpitations.  Gastrointestinal: Denies nausea, vomiting, abdominal pain, and diarrhea Musculoskeletal: Denies myalgias, arthralgias, back pain, and gait problem.  Neurological: Denies dizziness, syncope, weakness, lightheadedness, and headaches.  Psychiatric/Behavioral: Denies mood changes, sleep disturbance, and agitation.  Objective:   Physical Exam: Filed Vitals:   01/04/15 1556  BP: 136/82  Pulse: 85  Temp: 97.7 F (36.5 C)  TempSrc: Oral  Weight: 212 lb (96.163 kg)  SpO2: 98%    General: Overweight AA male, alert, cooperative, NAD. HEENT: PERRL, EOMI. Moist mucus membranes Neck: Full range of motion without pain, supple, no lymphadenopathy or carotid bruits Lungs: Clear to ascultation bilaterally, normal work of respiration, no wheezes, rales, rhonchi Heart: RRR, no murmurs, gallops, or rubs Abdomen: Soft, non-tender, non-distended, BS + Extremities: No cyanosis, clubbing, or edema. Small areas of raised  erythema on left leg (thigh).  Neurologic: Alert & oriented x3, cranial nerves II-XII intact, strength grossly intact, sensation intact to light touch   Assessment & Plan:   Please see problem based assessment and plan.

## 2015-01-05 ENCOUNTER — Telehealth: Payer: Self-pay

## 2015-01-05 LAB — MICROALBUMIN / CREATININE URINE RATIO
Creatinine, Urine: 60.3 mg/dL
MICROALB/CREAT RATIO: 68.2 mg/g{creat} — AB (ref 0.0–30.0)
Microalbumin, Urine: 41.1 ug/mL

## 2015-01-05 LAB — LIPID PANEL
CHOL/HDL RATIO: 2.9 ratio (ref 0.0–5.0)
Cholesterol, Total: 216 mg/dL — ABNORMAL HIGH (ref 100–199)
HDL: 74 mg/dL (ref >39)
LDL CALC: 85 mg/dL (ref 0–99)
Triglycerides: 284 mg/dL — ABNORMAL HIGH (ref 0–149)
VLDL CHOLESTEROL CAL: 57 mg/dL — AB (ref 5–40)

## 2015-01-05 LAB — BMP8+ANION GAP
Anion Gap: 21 mmol/L — ABNORMAL HIGH (ref 10.0–18.0)
BUN/Creatinine Ratio: 22 — ABNORMAL HIGH (ref 9–20)
BUN: 26 mg/dL — ABNORMAL HIGH (ref 6–24)
CO2: 23 mmol/L (ref 18–29)
Calcium: 10 mg/dL (ref 8.7–10.2)
Chloride: 91 mmol/L — ABNORMAL LOW (ref 97–106)
Creatinine, Ser: 1.17 mg/dL (ref 0.76–1.27)
GFR calc non Af Amer: 70 mL/min/{1.73_m2} (ref >59)
GFR, EST AFRICAN AMERICAN: 81 mL/min/{1.73_m2} (ref >59)
GLUCOSE: 445 mg/dL — AB (ref 65–99)
POTASSIUM: 3.9 mmol/L (ref 3.5–5.2)
SODIUM: 135 mmol/L — AB (ref 136–144)

## 2015-01-05 LAB — HIV ANTIBODY (ROUTINE TESTING W REFLEX): HIV Screen 4th Generation wRfx: NONREACTIVE

## 2015-01-05 LAB — HEPATITIS C ANTIBODY: Hep C Virus Ab: <0.1 s/co ratio (ref 0.0–0.9)

## 2015-01-05 NOTE — Assessment & Plan Note (Signed)
Refill lipitor

## 2015-01-05 NOTE — Telephone Encounter (Signed)
Pharmacy updated.  Also called in atarax and kenalog.

## 2015-01-05 NOTE — Telephone Encounter (Signed)
10 mg as I prescribed.

## 2015-01-05 NOTE — Telephone Encounter (Signed)
[facility] requesting updated rx for glipizide.  Need clarification on actual strength as directions say 1 tablet 5mg  but dose says 10mg  tab.   Please advise.

## 2015-01-05 NOTE — Assessment & Plan Note (Signed)
Patient w/ idiopathic urticaria, took benadryl at home but had continued pruritis, went to the ED 3 days ago and was given a course of Prednisone. Today, CBG's quite elevated into the 400's. No improvement w/ Prednisone but interval disappearance if itchy lesions, specifically on his left thigh. Says they come and go. Does not appear like a rash such as contact dermatitis, etc. Also, patient has not used new deodorant, soap, detergent, etc. No new medications. Discussed cause of urticaria, ie: allergies, temperature changes, stress, exercise, etc.  -Refill Atarax #30 -Discontinue Prednisone given lack of benefit and effect on CBG's -Return in 3 months or sooner if still bothersome. Can consider referral to allergist if continues to be an issue.

## 2015-01-05 NOTE — Assessment & Plan Note (Signed)
Check BMP, lipids, foot exam, HCV, HIV, flu shot today.

## 2015-01-05 NOTE — Assessment & Plan Note (Signed)
Lab Results  Component Value Date   HGBA1C 8.6 01/04/2015   HGBA1C 6.8 11/24/2012   HGBA1C 7.2 08/11/2012     Assessment: Diabetes control: fair control Progress toward A1C goal:  unchanged Comments: Patient has not been seen in the clinic since 11/2012 as he has been recently incarcerated. Patient states his blood sugar was quite elevated while in prison, HbA1c as high as 15 per patient. Restarted on Metformin and Glipizide 10 mg qd at that time. States his last HbA1c that he knows of was 8.9. Repeat today 8.6. Patient says he is checking his CBG' fastin in the AM and PM and claims they are NEVER over 160. He also states he has had several readings in the 70-80 range and also lower at one time (60's). I suspect that the patient has some post-prandial increases in his CBG's that are not being monitored, however, also somewhat concerned about the possibility of hypoglycemia if I were to add an agent.   Plan: Medications:  continue current medications; Metformin 1000 mg bid + Glipizide 10 mg qAM. Can consider splitting sulfonurea to bid dosing in the future vs adding another agent, however, patient does not currently have any insurance. Could also consider the addition of 70/30 insulin as well, however, recent reports of low CBG's also somewhat concerning. Also feel recent improvement in social situation could benefit his blood sugar control.  Home glucose monitoring: Frequency: 2 times a day Timing: before breakfast, before dinner Instruction/counseling given: reminded to bring blood glucose meter & log to each visit, reminded to bring medications to each visit, discussed foot care, discussed the need for weight loss and discussed diet Educational resources provided: brochure Self management tools provided:   Other plans: RTC in 3 months. Can consider referral to diabetes educator for dietary discussion as well in the future.  Check microalbumin/Cr ratio, if elevated, can consider addition of ACEI  in the future.

## 2015-01-06 ENCOUNTER — Ambulatory Visit: Payer: Self-pay | Admitting: Internal Medicine

## 2015-01-06 NOTE — Progress Notes (Signed)
Internal Medicine Clinic Attending  Case discussed with Dr. Jones at the time of the visit.  We reviewed the resident's history and exam and pertinent patient test results.  I agree with the assessment, diagnosis, and plan of care documented in the resident's note.  

## 2015-01-06 NOTE — Telephone Encounter (Signed)
Post discharge follow-up, unable to reach patient.

## 2015-01-06 NOTE — Addendum Note (Signed)
Addended by: Erlinda HongVINCENT, DUNCAN T on: 01/06/2015 09:37 AM   Modules accepted: Level of Service

## 2015-01-19 ENCOUNTER — Encounter: Payer: Self-pay | Admitting: Dietician

## 2015-01-20 ENCOUNTER — Other Ambulatory Visit: Payer: Self-pay | Admitting: Internal Medicine

## 2015-01-20 DIAGNOSIS — E118 Type 2 diabetes mellitus with unspecified complications: Secondary | ICD-10-CM

## 2015-01-20 DIAGNOSIS — E1136 Type 2 diabetes mellitus with diabetic cataract: Secondary | ICD-10-CM

## 2015-05-09 ENCOUNTER — Telehealth: Payer: Self-pay | Admitting: Internal Medicine

## 2015-05-09 NOTE — Telephone Encounter (Signed)
APPT. REMINDER CALL, LMTCB IF HE NEEDS TO CANCEL °

## 2015-05-10 ENCOUNTER — Encounter: Payer: Self-pay | Admitting: Internal Medicine

## 2015-05-24 ENCOUNTER — Encounter: Payer: Self-pay | Admitting: Internal Medicine

## 2015-05-24 ENCOUNTER — Telehealth: Payer: Self-pay | Admitting: Dietician

## 2015-05-24 NOTE — Telephone Encounter (Addendum)
Called patient's pharmacy as part of project trying to help patient's with a1C between 8-9% lower their blood sugars to target [facility] will [facility] from [facility] profile[facility]  [facility]  1. Metformin- 1000mg  twice daily- transferred out on 01/04/2015  2. Glipizide- rx reads 10 mg xl- take 5 mg daily, [facility] to [facility] also on 01/04/2015 3. lipitor- [facility] to [facility] 05/07/2015 4. Tramadol- nothing since 09/17/2013 put on file  Will remove [facility] from his pharmacy profile  called [facility]:He [address] [phone] for refill history: 1. Metformin- 1000mg  twice daily-04/2015- 60 tablets -  2. Glipizide- rx reads 10 mg xl--30 tablets 04/12/2015-they have a refill ready for this 3. lipitor- 20 mg daily- picked up on 05/07/2015 4. Tramadol- 50 mg - 1 tablet twice a day as needed- they do not have a script for this

## 2015-07-02 ENCOUNTER — Encounter (HOSPITAL_COMMUNITY): Payer: Self-pay | Admitting: Nurse Practitioner

## 2015-07-02 ENCOUNTER — Emergency Department (HOSPITAL_COMMUNITY)
Admission: EM | Admit: 2015-07-02 | Discharge: 2015-07-02 | Disposition: A | Discharge status: Home / Self Care | Payer: Self-pay | Attending: Emergency Medicine | Admitting: Emergency Medicine

## 2015-07-02 DIAGNOSIS — Z79899 Other long term (current) drug therapy: Secondary | ICD-10-CM | POA: Insufficient documentation

## 2015-07-02 DIAGNOSIS — Z7982 Long term (current) use of aspirin: Secondary | ICD-10-CM | POA: Insufficient documentation

## 2015-07-02 DIAGNOSIS — G8929 Other chronic pain: Secondary | ICD-10-CM | POA: Insufficient documentation

## 2015-07-02 DIAGNOSIS — R21 Rash and other nonspecific skin eruption: Secondary | ICD-10-CM | POA: Insufficient documentation

## 2015-07-02 DIAGNOSIS — E785 Hyperlipidemia, unspecified: Secondary | ICD-10-CM | POA: Insufficient documentation

## 2015-07-02 DIAGNOSIS — Z87891 Personal history of nicotine dependence: Secondary | ICD-10-CM | POA: Insufficient documentation

## 2015-07-02 DIAGNOSIS — Z7984 Long term (current) use of oral hypoglycemic drugs: Secondary | ICD-10-CM | POA: Insufficient documentation

## 2015-07-02 DIAGNOSIS — Z87438 Personal history of other diseases of male genital organs: Secondary | ICD-10-CM | POA: Insufficient documentation

## 2015-07-02 DIAGNOSIS — Z7952 Long term (current) use of systemic steroids: Secondary | ICD-10-CM | POA: Insufficient documentation

## 2015-07-02 DIAGNOSIS — E119 Type 2 diabetes mellitus without complications: Secondary | ICD-10-CM | POA: Insufficient documentation

## 2015-07-02 MED ORDER — DIPHENHYDRAMINE HCL 25 MG PO TABS: 25.0000 mg | ORAL_TABLET | Freq: Four times a day (QID) | ORAL | Status: DC

## 2015-07-02 MED ORDER — PERMETHRIN 5 % EX CREA: TOPICAL_CREAM | CUTANEOUS | Status: DC

## 2015-07-02 MED ORDER — PREDNISONE 10 MG (21) PO TBPK: 10.0000 mg | ORAL_TABLET | Freq: Every day | ORAL | Status: DC

## 2015-07-02 MED ORDER — DEXAMETHASONE SODIUM PHOSPHATE 10 MG/ML IJ SOLN
10.0000 mg | Freq: Once | INTRAMUSCULAR | Status: AC
  Administered 2015-07-02: 10 mg via INTRAMUSCULAR
  Filled 2015-07-02: qty 1

## 2015-07-02 NOTE — ED Provider Notes (Signed)
CSN: [id number]     Arrival date & time 07/02/15  1337 History  By signing my name below, I, Jacob Durham, attest that this documentation has been prepared under the direction and in the presence of S. Lane Hacker, PA-C Electronically Signed: Soijett Durham, ED Scribe. 07/02/2015. 3:05 PM.    Chief Complaint  Patient presents with  . Rash      The history is provided by the patient. No language interpreter was used.    Jacob Durham is a 55 y.o. male with a medical hx of DM who presents to the Emergency Department complaining of pruritic rash to bilateral upper and lower extremities onset 1 week. Pt notes that he was working outside yesterday. Pt denies new soaps/medications/pets/environment/lotion/detergent.  Pt is having associated symptoms of color change. He notes that he has tried calamine lotion with no relief of his symptoms. He denies wound, throat closing sensation, fever, chills, and any other symptoms.    Past Medical History  Diagnosis Date  . Diabetes mellitus   . Diabetic cataract(366.41)   . Erectile dysfunction   . Hyperlipidemia   . Chronic back pain    Past Surgical History  Procedure Laterality Date  . Cataract extraction  2007   Family History  Problem Relation Age of Onset  . Diabetes Maternal Grandmother   . Hypertension Maternal Grandmother    Social History  Substance Use Topics  . Smoking status: Former Smoker -- 0.25 packs/day for 5 years    Types: Cigarettes    Quit date: 03/07/2001  . Smokeless tobacco: None  . Alcohol Use: 8.4 oz/week    14 Cans of beer per week    Review of Systems  A complete 10 system review of systems was obtained and all systems are negative except as noted in the HPI and PMH.   Allergies  Review of patient's allergies indicates no known allergies.  Home Medications   Prior to Admission medications   Medication Sig Start Date End Date Taking? Authorizing Provider  aspirin 81 MG EC tablet Take 1 tablet (81 mg  total) by mouth daily. With meal 11/06/11   Ignacia Palma, MD  atorvastatin (LIPITOR) 20 MG tablet Take 1 tablet (20 mg total) by mouth daily. 01/04/15 01/04/16  Courtney Paris, MD  clotrimazole-betamethasone (LOTRISONE) cream Apply 1 application topically 2 (two) times daily. For the rash on your foot 06/19/13   Mancel Bale, MD  glipiZIDE (GLUCOTROL XL) 10 MG 24 hr tablet TAKE 1 TABLET (5 MG TOTAL) BY MOUTH DAILY. 01/04/15   Courtney Paris, MD  hydrOXYzine (ATARAX/VISTARIL) 25 MG tablet Take 1 tablet (25 mg total) by mouth every 6 (six) hours. 01/04/15   Courtney Paris, MD  ibuprofen (ADVIL,MOTRIN) 200 MG tablet Take 200-400 mg by mouth every 6 (six) hours as needed. For pain.     Historical Provider, MD  Lancets MISC Use to test blood sugar every other morning before eating 11/06/11   Ignacia Palma, MD  metFORMIN (GLUCOPHAGE) 1000 MG tablet Take 1 tablet (1,000 mg total) by mouth 2 (two) times daily with a meal. 01/04/15   Courtney Paris, MD  Omega-3 Fatty Acids (FISH OIL) 1000 MG CAPS Take 1 capsule by mouth 2 (two) times daily.      Historical Provider, MD  traMADol (ULTRAM) 50 MG tablet TAKE 1 TABLET BY MOUTH TWICE DAILY AS NEEDED 10/16/13   Courtney Paris, MD  triamcinolone cream (KENALOG) 0.1 % Apply 1 application topically 4 (  four) times daily as needed. 12/31/14   Abigail Harris, PA-C   BP 110/78 mmHg  Pulse 73  Temp(Src) 97.6 F (36.4 C) (Oral)  Resp 17  SpO2 98% Physical Exam  Constitutional: He is oriented to person, place, and time. He appears well-developed and well-nourished. No distress.  HENT:  Head: Normocephalic and atraumatic.  Eyes: EOM are normal.  Neck: Neck supple.  Cardiovascular: Normal rate.   Pulmonary/Chest: Effort normal. No respiratory distress.  Abdominal: He exhibits no distension.  Musculoskeletal: Normal range of motion.  Neurological: He is alert and oriented to person, place, and time.  Skin: Skin is warm and dry. Rash noted. Rash is macular and maculopapular.  There is erythema.  Erythematous maculopapular rash in web spacing of bilateral hands extending to bilateral forearms. Some linear pattern noticed. Lower legs with erythematous macular rash bilaterally with excoriation, stops abruptly at sock line.   Psychiatric: He has a normal mood and affect. His behavior is normal.  Nursing note and vitals reviewed.   ED Course  Procedures DIAGNOSTIC STUDIES: Oxygen Saturation is 98% on RA, nl by my interpretation.    COORDINATION OF CARE: 3:05 PM Discussed treatment plan with pt at bedside which includes permethrin cream, benadryl, and prednisone,  and pt agreed to plan.  MDM   Final diagnoses:  Rash   Discussed diagnosis & treatment of scabies with patient.  They have been advised to followup with her primary care doctor 2 weeks after treatment. They have also been advised to clean entire household including washing sheets in the hottest water possible and using R.I.D. spray in the car and on sofa. Discussed the proper application of permethrin cream. Patient  advised to repeat treatment in one week. No signs of secondary infection.  Discussed return precautions. Patient advised to follow up with PCP for unresolved symptoms. Patient appears safe for discharge.  I personally performed the services described in this documentation, which was scribed in my presence. The recorded information has been reviewed and is accurate.    Melton KrebsSamantha Nicole Daryle Boyington, PA-C 07/03/15 1731  Lorre NickAnthony Allen, MD 07/08/15 [phone]

## 2015-07-02 NOTE — Discharge Instructions (Signed)
Mr. Evon SlackDavid A Feinberg,  Nice meeting you! Please follow-up with your primary care provider. You don't have to use the prescription for benadryl (you can get this over the counter if it is less expensive). Return to the emergency department if you develop shortness of breath, throat closing/swelling/itching. Feel better soon!  S. Lane HackerNicole Mattheo Swindle, PA-C

## 2015-07-02 NOTE — ED Notes (Signed)
Itching red, rash to both arms and legs (stops at sock line) x 1 week. Has been treating with Calomine lotion without improvement.

## 2015-07-02 NOTE — ED Notes (Signed)
He c/o 1 week history itchy burning rash to BUE, BLE. He has tried calamine lotion with no relief. He was working outside prior to onset of rash

## 2015-07-04 ENCOUNTER — Telehealth: Payer: Self-pay | Admitting: Pharmacist

## 2015-07-04 NOTE — Telephone Encounter (Addendum)
Steroid-Induced Hyperglycemia Prevention and Management Jacob SlackDavid A Durham is a 55 y.o. male who [facility] (diabetes patient prescribed short course of steroids).  A/P Current Regimen  Patient prescribed prednisone for pruritic rash: 6 tabs by mouth daily for 2 days, then 5 tabs for 2 days, then 4 tabs for 2 days, then 3 tabs for 2 days, 2 tabs for 2 days, then 1 tab by mouth daily for 2 days  Currently on day 2 of prednisone regimen  For DM, taking glipizide 5 mg daily  Home BG Monitoring  Patient does check BG at home and does have a meter at home.   CBGs at home 120s  CBGs prior to steroid course 100s, A1C prior to steroid course 8.6 (01/04/15)  S/Sx of hyperglycemia: patient states he had some excessive thirst and urination last night, but no concerns today  Medication Management No additional treatment needed at this time  Patient Education  Advised patient to monitor BG while on steroid therapy (at least twice daily prior to first 2 meals of the day).  Patient educated about signs/symptoms and advised to contact clinic if hyper- or hypoglycemic or if BG remain elevated  Patient did  verbalize understanding of information and regimen by repeating back topics discussed.  Follow-up Daily  Jacob Durham,Jacob Durham 6:55 PM 07/04/2015

## 2015-07-07 NOTE — Telephone Encounter (Signed)
Patient unable to reach x 3

## 2016-07-07 ENCOUNTER — Emergency Department (HOSPITAL_COMMUNITY)
Admission: EM | Admit: 2016-07-07 | Discharge: 2016-07-07 | Disposition: A | Discharge status: Home / Self Care | Payer: Self-pay | Attending: Emergency Medicine | Admitting: Emergency Medicine

## 2016-07-07 ENCOUNTER — Emergency Department (HOSPITAL_COMMUNITY): Payer: Self-pay

## 2016-07-07 ENCOUNTER — Encounter (HOSPITAL_COMMUNITY): Payer: Self-pay

## 2016-07-07 DIAGNOSIS — Z87891 Personal history of nicotine dependence: Secondary | ICD-10-CM | POA: Insufficient documentation

## 2016-07-07 DIAGNOSIS — Z79899 Other long term (current) drug therapy: Secondary | ICD-10-CM | POA: Insufficient documentation

## 2016-07-07 DIAGNOSIS — Z7982 Long term (current) use of aspirin: Secondary | ICD-10-CM | POA: Insufficient documentation

## 2016-07-07 DIAGNOSIS — E119 Type 2 diabetes mellitus without complications: Secondary | ICD-10-CM | POA: Insufficient documentation

## 2016-07-07 DIAGNOSIS — Z7984 Long term (current) use of oral hypoglycemic drugs: Secondary | ICD-10-CM | POA: Insufficient documentation

## 2016-07-07 DIAGNOSIS — J069 Acute upper respiratory infection, unspecified: Secondary | ICD-10-CM | POA: Insufficient documentation

## 2016-07-07 DIAGNOSIS — B9789 Other viral agents as the cause of diseases classified elsewhere: Secondary | ICD-10-CM

## 2016-07-07 MED ORDER — DEXAMETHASONE SODIUM PHOSPHATE 4 MG/ML IJ SOLN
8.0000 mg | Freq: Once | INTRAMUSCULAR | Status: AC
  Administered 2016-07-07: 8 mg via INTRAMUSCULAR
  Filled 2016-07-07: qty 2

## 2016-07-07 MED ORDER — PROMETHAZINE-DM 6.25-15 MG/5ML PO SYRP
5.0000 mL | ORAL_SOLUTION | Freq: Four times a day (QID) | ORAL | 0 refills | Status: DC | PRN
Start: 2016-07-07 — End: 2018-12-23

## 2016-07-07 MED ORDER — GUAIFENESIN ER 1200 MG PO TB12: 1.0000 | ORAL_TABLET | Freq: Two times a day (BID) | ORAL | 0 refills | Status: DC

## 2016-07-07 NOTE — ED Provider Notes (Signed)
[facility]-EMERGENCY DEPT Provider Note   CSN: [id number] Arrival date & time: 07/07/16  0819     History   Chief Complaint Chief Complaint  Patient presents with  . URI    HPI Jacob SlackDavid A Durham is a 56 y.o. male.  HPI Patient presents to the emergency department with cough, sore throat, nasal congestion, nasal drainage, and hoarse voice over the last 24 hours.  The patient states that he noticed that he was getting hoarseness to his voice last night and when he woke up this morning with symptoms or worse.  He did not take any medications prior to arrival.  Patient states that he does not think he had any fevers. The patient denies chest pain, shortness of breath, headache,blurred vision, neck pain, fever,  weakness, numbness, dizziness, anorexia, edema, abdominal pain, nausea, vomiting, diarrhea, rash, back pain, dysuria, hematemesis, bloody stool, near syncope, or syncope. Past Medical History:  Diagnosis Date  . Chronic back pain   . Diabetes mellitus   . Diabetic cataract(366.41)   . Erectile dysfunction   . Hyperlipidemia     Patient Active Problem List   Diagnosis Date Noted  . Urticaria 01/04/2015  . Health care maintenance 11/06/2011  . HLD (hyperlipidemia) 09/27/2008  . ERECTILE DYSFUNCTION, NON-ORGANIC 09/27/2008  . Diabetic cataract (HCC) 09/27/2008  . BACK PAIN, CHRONIC 09/27/2008  . Type II diabetes mellitus with manifestations (HCC) 03/06/1999    Past Surgical History:  Procedure Laterality Date  . CATARACT EXTRACTION  2007       Home Medications    Prior to Admission medications   Medication Sig Start Date End Date Taking? Authorizing Provider  aspirin 81 MG EC tablet Take 1 tablet (81 mg total) by mouth daily. With meal 11/06/11   Ziemer, Wynelle Clevelandarolyn M, MD  atorvastatin (LIPITOR) 20 MG tablet Take 1 tablet (20 mg total) by mouth daily. 01/04/15 01/04/16  Courtney ParisJones, Eden W, MD  clotrimazole-betamethasone (LOTRISONE) cream Apply 1 application topically 2 (two) times  daily. For the rash on your foot 06/19/13   Mancel BaleWentz, Elliott, MD  diphenhydrAMINE (BENADRYL) 25 MG tablet Take 1 tablet (25 mg total) by mouth every 6 (six) hours. 07/02/15   Melton Krebsiley, Samantha Nicole, PA-C  glipiZIDE (GLUCOTROL XL) 10 MG 24 hr tablet TAKE 1 TABLET (5 MG TOTAL) BY MOUTH DAILY. 01/04/15   Courtney ParisJones, Eden W, MD  hydrOXYzine (ATARAX/VISTARIL) 25 MG tablet Take 1 tablet (25 mg total) by mouth every 6 (six) hours. 01/04/15   Courtney ParisJones, Eden W, MD  ibuprofen (ADVIL,MOTRIN) 200 MG tablet Take 200-400 mg by mouth every 6 (six) hours as needed. For pain.     [provider]  Lancets MISC Use to test blood sugar every other morning before eating 11/06/11   Ziemer, Wynelle Clevelandarolyn M, MD  metFORMIN (GLUCOPHAGE) 1000 MG tablet Take 1 tablet (1,000 mg total) by mouth 2 (two) times daily with a meal. 01/04/15   Courtney ParisJones, Eden W, MD  Omega-3 Fatty Acids (FISH OIL) 1000 MG CAPS Take 1 capsule by mouth 2 (two) times daily.      [provider]  permethrin (ELIMITE) 5 % cream Apply to affected area once, wash off after 8-14 hours, then repeat in 1 week. 07/02/15   Melton Krebsiley, Samantha Nicole, PA-C  predniSONE (STERAPRED UNI-PAK 21 TAB) 10 MG (21) TBPK tablet Take 1 tablet (10 mg total) by mouth daily. Take 6 tabs by mouth daily  for 2 days, then 5 tabs for 2 days, then 4 tabs for 2 days, then 3  tabs for 2 days, 2 tabs for 2 days, then 1 tab by mouth daily for 2 days 07/02/15   Melton Krebs, PA-C  traMADol (ULTRAM) 50 MG tablet TAKE 1 TABLET BY MOUTH TWICE DAILY AS NEEDED 10/16/13   Courtney Paris, MD  triamcinolone cream (KENALOG) 0.1 % Apply 1 application topically 4 (four) times daily as needed. 12/31/14   Arthor Captain, PA-C    Family History Family History  Problem Relation Age of Onset  . Diabetes Maternal Grandmother   . Hypertension Maternal Grandmother     Social History Social History  Substance Use Topics  . Smoking status: Former Smoker    Packs/day: 0.25    Years: 5.00    Types: Cigarettes     Quit date: 03/07/2001  . Smokeless tobacco: Not on file  . Alcohol use 8.4 oz/week    14 Cans of beer per week     Allergies   Patient has no known allergies.   Review of Systems Review of Systems All other systems negative except as documented in the HPI. All pertinent positives and negatives as reviewed in the HPI.  Physical Exam Updated Vital Signs BP 122/74 (BP Location: Left Arm)   Pulse 95   Temp 97.9 F (36.6 C) (Oral)   Resp 20   SpO2 99%   Physical Exam  Constitutional: He is oriented to person, place, and time. He appears well-developed and well-nourished. No distress.  HENT:  Head: Normocephalic and atraumatic.  Right Ear: Tympanic membrane normal.  Left Ear: Tympanic membrane normal.  Mouth/Throat: Oropharynx is clear and moist.  Eyes: Pupils are equal, round, and reactive to light.  Neck: Normal range of motion. Neck supple.  Cardiovascular: Normal rate, regular rhythm and normal heart sounds.  Exam reveals no gallop and no friction rub.   No murmur heard. Pulmonary/Chest: Effort normal and breath sounds normal. No respiratory distress. He has no wheezes.  Abdominal: Soft. Bowel sounds are normal. He exhibits no distension. There is no tenderness.  Neurological: He is alert and oriented to person, place, and time. He exhibits normal muscle tone. Coordination normal.  Skin: Skin is warm and dry. Capillary refill takes less than 2 seconds. No rash noted. No erythema.  Psychiatric: He has a normal mood and affect. His behavior is normal.  Nursing note and vitals reviewed.    ED Treatments / Results  Labs (all labs ordered are listed, but only abnormal results are displayed) Labs Reviewed - No data to display  EKG  EKG Interpretation None       Radiology Dg Chest 2 View  Result Date: 07/07/2016 CLINICAL DATA:  Productive cough for 2 days and mid chest pain. EXAM: CHEST  2 VIEW COMPARISON:  None. FINDINGS: The heart size and mediastinal contours are  within normal limits. There is no evidence of pulmonary edema, consolidation, pneumothorax, nodule or pleural fluid. The visualized skeletal structures are unremarkable. IMPRESSION: No active cardiopulmonary disease. Electronically Signed   By: Irish Lack M.D.   On: 07/07/2016 10:07    Procedures Procedures (including critical care time)  Medications Ordered in ED Medications - No data to display   Initial Impression / Assessment and Plan / ED Course  I have reviewed the triage vital signs and the nursing notes.  Pertinent labs & imaging results that were available during my care of the patient were reviewed by me and considered in my medical decision making (see chart for details).     Patient be treated  for a URI with cough.  The patient does not have any signs of pneumonia on chest x-ray.  Lung exam was normal as well.  Patient is advised follow-up with his primary care Dr. told to return here as needed.  Patient agrees the plan and all questions were answered.  Patient has been stable here in the emergency department.   Final Clinical Impressions(s) / ED Diagnoses   Final diagnoses:  None    New Prescriptions New Prescriptions   No medications on file     Charlestine Night, Cordelia Poche 07/07/16 1016    Little, Ambrose Finland, MD 07/07/16 1450

## 2016-07-07 NOTE — Discharge Instructions (Signed)
Follow-up with your primary doctor, increase your fluid intake, rest as much as possible.  Your chest x-ray did not show any signs of pneumonia at this point.

## 2016-07-07 NOTE — ED Triage Notes (Signed)
He c/o uri sx, plus "losing my voice" x 2 days.

## 2016-07-07 NOTE — ED Notes (Signed)
ED Provider at bedside. 

## 2016-08-30 ENCOUNTER — Ambulatory Visit: Payer: Self-pay

## 2018-12-20 ENCOUNTER — Emergency Department (HOSPITAL_COMMUNITY): Payer: Self-pay

## 2018-12-20 ENCOUNTER — Emergency Department (HOSPITAL_COMMUNITY)
Admission: EM | Admit: 2018-12-20 | Discharge: 2018-12-20 | Disposition: A | Discharge status: Home / Self Care | Payer: Self-pay | Attending: Emergency Medicine | Admitting: Emergency Medicine

## 2018-12-20 ENCOUNTER — Encounter (HOSPITAL_COMMUNITY): Payer: Self-pay | Admitting: Emergency Medicine

## 2018-12-20 ENCOUNTER — Other Ambulatory Visit: Payer: Self-pay

## 2018-12-20 DIAGNOSIS — E860 Dehydration: Secondary | ICD-10-CM | POA: Insufficient documentation

## 2018-12-20 DIAGNOSIS — Z87891 Personal history of nicotine dependence: Secondary | ICD-10-CM | POA: Insufficient documentation

## 2018-12-20 DIAGNOSIS — R197 Diarrhea, unspecified: Secondary | ICD-10-CM

## 2018-12-20 DIAGNOSIS — U071 COVID-19: Secondary | ICD-10-CM | POA: Insufficient documentation

## 2018-12-20 DIAGNOSIS — E119 Type 2 diabetes mellitus without complications: Secondary | ICD-10-CM | POA: Insufficient documentation

## 2018-12-20 DIAGNOSIS — R55 Syncope and collapse: Secondary | ICD-10-CM | POA: Insufficient documentation

## 2018-12-20 DIAGNOSIS — R05 Cough: Secondary | ICD-10-CM

## 2018-12-20 DIAGNOSIS — Z7984 Long term (current) use of oral hypoglycemic drugs: Secondary | ICD-10-CM | POA: Insufficient documentation

## 2018-12-20 DIAGNOSIS — R059 Cough, unspecified: Secondary | ICD-10-CM

## 2018-12-20 DIAGNOSIS — Z7982 Long term (current) use of aspirin: Secondary | ICD-10-CM | POA: Insufficient documentation

## 2018-12-20 DIAGNOSIS — Z79899 Other long term (current) drug therapy: Secondary | ICD-10-CM | POA: Insufficient documentation

## 2018-12-20 LAB — CBC WITH DIFFERENTIAL/PLATELET
Abs Immature Granulocytes: 0 10*3/uL (ref 0.00–0.07)
Basophils Absolute: 0 10*3/uL (ref 0.0–0.1)
Basophils Relative: 1 %
Eosinophils Absolute: 0 10*3/uL (ref 0.0–0.5)
Eosinophils Relative: 0 %
HCT: 42.3 % (ref 39.0–52.0)
Hemoglobin: 14.6 g/dL (ref 13.0–17.0)
Immature Granulocytes: 0 %
Lymphocytes Relative: 29 %
Lymphs Abs: 1.1 10*3/uL (ref 0.7–4.0)
MCH: 32.1 pg (ref 26.0–34.0)
MCHC: 34.5 g/dL (ref 30.0–36.0)
MCV: 93 fL (ref 80.0–100.0)
Monocytes Absolute: 0.3 10*3/uL (ref 0.1–1.0)
Monocytes Relative: 9 %
Neutro Abs: 2.3 10*3/uL (ref 1.7–7.7)
Neutrophils Relative %: 61 %
Platelets: 120 10*3/uL — ABNORMAL LOW (ref 150–400)
RBC: 4.55 MIL/uL (ref 4.22–5.81)
RDW: 12.3 % (ref 11.5–15.5)
WBC: 3.7 10*3/uL — ABNORMAL LOW (ref 4.0–10.5)
nRBC: 0 % (ref 0.0–0.2)

## 2018-12-20 LAB — CBG MONITORING, ED: Glucose-Capillary: 104 mg/dL — ABNORMAL HIGH (ref 70–99)

## 2018-12-20 LAB — URINALYSIS, ROUTINE W REFLEX MICROSCOPIC
Bacteria, UA: NONE SEEN
Bilirubin Urine: NEGATIVE
Glucose, UA: NEGATIVE mg/dL
Ketones, ur: 20 mg/dL — AB
Leukocytes,Ua: NEGATIVE
Nitrite: NEGATIVE
Protein, ur: 30 mg/dL — AB
Specific Gravity, Urine: 1.016 (ref 1.005–1.030)
pH: 5 (ref 5.0–8.0)

## 2018-12-20 LAB — COMPREHENSIVE METABOLIC PANEL
ALT: 18 U/L (ref 0–44)
AST: 26 U/L (ref 15–41)
Albumin: 3.8 g/dL (ref 3.5–5.0)
Alkaline Phosphatase: 62 U/L (ref 38–126)
Anion gap: 10 (ref 5–15)
BUN: 12 mg/dL (ref 6–20)
CO2: 23 mmol/L (ref 22–32)
Calcium: 8.5 mg/dL — ABNORMAL LOW (ref 8.9–10.3)
Chloride: 102 mmol/L (ref 98–111)
Creatinine, Ser: 1.16 mg/dL (ref 0.61–1.24)
GFR calc Af Amer: >60 mL/min (ref >60)
GFR calc non Af Amer: >60 mL/min (ref >60)
Glucose, Bld: 124 mg/dL — ABNORMAL HIGH (ref 70–99)
Potassium: 3.9 mmol/L (ref 3.5–5.1)
Sodium: 135 mmol/L (ref 135–145)
Total Bilirubin: 1 mg/dL (ref 0.3–1.2)
Total Protein: 7 g/dL (ref 6.5–8.1)

## 2018-12-20 LAB — SARS CORONAVIRUS 2 (TAT 6-24 HRS): SARS Coronavirus 2: POSITIVE — AB

## 2018-12-20 MED ORDER — SODIUM CHLORIDE 0.9 % IV BOLUS (SEPSIS)
1000.0000 mL | Freq: Once | INTRAVENOUS | Status: AC
  Administered 2018-12-20: 1000 mL via INTRAVENOUS

## 2018-12-20 NOTE — Discharge Instructions (Signed)
I recommend that you drink 60 to 80 ounces of water a day.  You may take Imodium over-the-counter as needed for diarrhea.  I recommend you avoid caffeine and alcohol at this time.  You may take Tylenol 1000 mg every 6 hours as needed for pain, fever.  I suspect you may have a viral illness causing your cough and diarrhea.  Your COVID test is pending.  You may follow-up on these results in my chart.  You do not need antibiotics at this time.  If you develop chest pain/tightness/pressure, shortness of breath, abdominal pain, vomiting that cannot stop, blood in your stool, black and tarry stools, severe headache, numbness or weakness on one side of your body, vision or speech changes, please return to the emergency department.   Steps to find a Primary Care Provider (PCP):  Call [phone] or [phone] to access "[facility]."  2.  You may also go on the [facility] website at [email or web address]  3.  [facility] also frequently accepts new patients.  [place] [phone]  4.  There are also multiple [facility], [facility] and [facility] practices throughout the [place] that are frequently accepting new patients. You may find a clinic that is close to your home and contact them.  [email or web address] [phone]  [facility] [email or web address]  [email or web address] [place] [email or web address] [phone]  5.  Local Health Departments also can provide primary care services.  [facility]  [place] [address] [phone]  [facility] [place] [address] [place] [facility] [place] [phone]

## 2018-12-20 NOTE — ED Triage Notes (Signed)
Pt brought to ED by [facility] from home a syncope episode at home after he walk to the bathroom. Pt started feeling very dizzy and he pass out after. Pt states he is been having diarrhea x 2 weeks. SR on monitor, cbg 129. Orthostatic sit BP 121/76, HR 76, standing 113/60, HR 90. 500 mL NS bolus given by EMS pta.

## 2018-12-20 NOTE — ED Provider Notes (Signed)
TIME SEEN: 4:36 AM  CHIEF COMPLAINT: Syncope  HPI: Patient is a 58 year old male with history of diabetes, hyperlipidemia who presents to the emergency department after a syncopal event.  States for the past month he has had cough but no chest pain or shortness of breath.  For the past 2 weeks he has had 3-4 episodes of nonbloody diarrhea every day and chills.  No abdominal pain.  No documented fever.  States tonight he got up to go to the bathroom and while in the bathroom he felt very lightheaded and hot and then had a syncopal event.  No witnessed seizure activity.  He is not sure if he hit his head but has no headache.  Not on antiplatelets or anticoagulants.  No numbness, tingling or focal weakness.  No preceding chest pain, shortness of breath.  Has never had anything like this before.  He is not sure if he has been exposed to COVID but states where he works he is exposed to lots of people regularly.  ROS: See HPI Constitutional: no fever  Eyes: no drainage  ENT: no runny nose   Cardiovascular:  no chest pain  Resp: no SOB  GI: no vomiting GU: no dysuria Integumentary: no rash  Allergy: no hives  Musculoskeletal: no leg swelling  Neurological: no slurred speech ROS otherwise negative  PAST MEDICAL HISTORY/PAST SURGICAL HISTORY:  Past Medical History:  Diagnosis Date  . Chronic back pain   . Diabetes mellitus   . Diabetic cataract(366.41)   . Erectile dysfunction   . Hyperlipidemia     MEDICATIONS:  Prior to Admission medications   Medication Sig Start Date End Date Taking? Authorizing Provider  aspirin 81 MG EC tablet Take 1 tablet (81 mg total) by mouth daily. With meal 11/06/11   Ziemer, Wynelle Clevelandarolyn M, MD  atorvastatin (LIPITOR) 20 MG tablet Take 1 tablet (20 mg total) by mouth daily. 01/04/15 01/04/16  Courtney ParisJones, Eden W, MD  clotrimazole-betamethasone (LOTRISONE) cream Apply 1 application topically 2 (two) times daily. For the rash on your foot 06/19/13   Mancel BaleWentz, Elliott, MD   diphenhydrAMINE (BENADRYL) 25 MG tablet Take 1 tablet (25 mg total) by mouth every 6 (six) hours. 07/02/15   Melton Krebsiley, Samantha Nicole, PA-C  glipiZIDE (GLUCOTROL XL) 10 MG 24 hr tablet TAKE 1 TABLET (5 MG TOTAL) BY MOUTH DAILY. 01/04/15   Courtney ParisJones, Eden W, MD  Guaifenesin 1200 MG TB12 Take 1 tablet (1,200 mg total) by mouth 2 (two) times daily. 07/07/16   Lawyer, Cristal Deerhristopher, PA-C  hydrOXYzine (ATARAX/VISTARIL) 25 MG tablet Take 1 tablet (25 mg total) by mouth every 6 (six) hours. 01/04/15   Courtney ParisJones, Eden W, MD  ibuprofen (ADVIL,MOTRIN) 200 MG tablet Take 200-400 mg by mouth every 6 (six) hours as needed. For pain.     [provider]  Lancets MISC Use to test blood sugar every other morning before eating 11/06/11   Ziemer, Wynelle Clevelandarolyn M, MD  metFORMIN (GLUCOPHAGE) 1000 MG tablet Take 1 tablet (1,000 mg total) by mouth 2 (two) times daily with a meal. 01/04/15   Courtney ParisJones, Eden W, MD  Omega-3 Fatty Acids (FISH OIL) 1000 MG CAPS Take 1 capsule by mouth 2 (two) times daily.      [provider]  permethrin (ELIMITE) 5 % cream Apply to affected area once, wash off after 8-14 hours, then repeat in 1 week. 07/02/15   Melton Krebsiley, Samantha Nicole, PA-C  predniSONE (STERAPRED UNI-PAK 21 TAB) 10 MG (21) TBPK tablet Take 1 tablet (10 mg  total) by mouth daily. Take 6 tabs by mouth daily  for 2 days, then 5 tabs for 2 days, then 4 tabs for 2 days, then 3 tabs for 2 days, 2 tabs for 2 days, then 1 tab by mouth daily for 2 days 07/02/15   Melton Krebs, PA-C  promethazine-dextromethorphan (PROMETHAZINE-DM) 6.25-15 MG/5ML syrup Take 5 mLs by mouth 4 (four) times daily as needed for cough. 07/07/16   Lawyer, Cristal Deer, PA-C  traMADol (ULTRAM) 50 MG tablet TAKE 1 TABLET BY MOUTH TWICE DAILY AS NEEDED 10/16/13   Courtney Paris, MD  triamcinolone cream (KENALOG) 0.1 % Apply 1 application topically 4 (four) times daily as needed. 12/31/14   Arthor Captain, PA-C    ALLERGIES:  No Known Allergies  SOCIAL HISTORY:  Social  History   Tobacco Use  . Smoking status: Former Smoker    Packs/day: 0.25    Years: 5.00    Pack years: 1.25    Types: Cigarettes    Quit date: 03/07/2001    Years since quitting: 17.8  Substance Use Topics  . Alcohol use: Yes    Alcohol/week: 14.0 standard drinks    Types: 14 Cans of beer per week    FAMILY HISTORY: Family History  Problem Relation Age of Onset  . Diabetes Maternal Grandmother   . Hypertension Maternal Grandmother     EXAM: BP 133/71 (BP Location: Right Arm)   Pulse 78   Temp 99.8 F (37.7 C) (Oral)   Resp 18   Ht 5' 6.5" (1.689 m)   Wt 83.9 kg   SpO2 98%   BMI 29.41 kg/m  CONSTITUTIONAL: Alert and oriented and responds appropriately to questions. Well-appearing; well-nourished HEAD: Normocephalic, atraumatic EYES: Conjunctivae clear, pupils appear equal, EOMI ENT: normal nose; moist mucous membranes NECK: Supple, no meningismus, no nuchal rigidity, no LAD; no midline cervical spine tenderness, step-off or deformity CARD: RRR; S1 and S2 appreciated; no murmurs, no clicks, no rubs, no gallops RESP: Normal chest excursion without splinting or tachypnea; breath sounds clear and equal bilaterally; no wheezes, no rhonchi, no rales, no hypoxia or respiratory distress, speaking full sentences ABD/GI: Normal bowel sounds; non-distended; soft, non-tender, no rebound, no guarding, no peritoneal signs, no hepatosplenomegaly BACK:  The back appears normal and is non-tender to palpation, there is no CVA tenderness; no midline spinal tenderness, step-off or deformity EXT: Normal ROM in all joints; non-tender to palpation; no edema; normal capillary refill; no cyanosis, no calf tenderness or swelling    SKIN: Normal color for age and race; warm; no rash NEURO: Moves all extremities equally, sensation to light touch intact diffusely, cranial nerves II through XII intact, normal speech PSYCH: The patient's mood and manner are appropriate. Grooming and personal hygiene are  appropriate.  MEDICAL DECISION MAKING: Patient here with syncopal event which could have been related to multiple recent episodes of diarrhea.  Orthostatic vital signs with EMS unremarkable will recheck here in the emergency department.  Will give IV fluids.  No chest pain or shortness of breath to suggest ACS, PE, dissection.  He has no headache and is not on blood thinners and is neurologically intact and not intoxicated.  I do not feel he needs head imaging.  Will obtain basic labs, blood glucose urinalysis, chest x-ray.  Patient does have a low-grade temperature here of 99.8 orally.  EKG shows no ischemic abnormality or arrhythmia.  ED PROGRESS: Patient's heart rate did rise approximately 20 beats from lying flat to standing but no significant  change in blood pressure.  Receiving IV fluids currently.  Will allow patient to eat and drink as well.  Labs, urine pending.  Blood glucose normal.  7:00 AM  Patient's labs are reassuring.  COVID test pending.  Urine pending.  7:35 AM  Pt's urine shows no sign of infection but does show small ketones.  I recommended increase fluid intake at home.  Will provide with work note.  Discussed return precautions and supportive care instructions.  Recommended Imodium over-the-counter as needed for diarrhea.  I do not feel he needs antibiotics at this time.  Discussed return precautions.   At this time, I do not feel there is any life-threatening condition present. I have reviewed and discussed all results (EKG, imaging, lab, urine as appropriate) and exam findings with patient/family. I have reviewed nursing notes and appropriate previous records.  I feel the patient is safe to be discharged home without further emergent workup and can continue workup as an outpatient as needed. Discussed usual and customary return precautions. Patient/family verbalize understanding and are comfortable with this plan.  Outpatient follow-up has been provided as needed. All questions  have been answered.     EKG Interpretation  Date/Time:  Saturday December 20 2018 04:53:36 EDT Ventricular Rate:  83 PR Interval:    QRS Duration: 73 QT Interval:  363 QTC Calculation: 427 R Axis:   96 Text Interpretation:  Sinus rhythm Borderline right axis deviation No significant change since last tracing Confirmed by Pryor Curia [phone]) on 12/20/2018 5:16:08 AM       Marilynne Drivers was evaluated in Emergency Department on 12/20/2018 for the symptoms described in the history of present illness. He was evaluated in the context of the global COVID-19 pandemic, which necessitated consideration that the patient might be at risk for infection with the SARS-CoV-2 virus that causes COVID-19. Institutional protocols and algorithms that pertain to the evaluation of patients at risk for COVID-19 are in a state of rapid change based on information released by regulatory bodies including the CDC and federal and state organizations. These policies and algorithms were followed during the patient's care in the ED.    Ysabella Babiarz, Delice Bison, DO 12/20/18 [phone]

## 2018-12-23 ENCOUNTER — Encounter (HOSPITAL_COMMUNITY): Payer: Self-pay | Admitting: Emergency Medicine

## 2018-12-23 ENCOUNTER — Inpatient Hospital Stay (HOSPITAL_COMMUNITY)
Admission: EM | Admit: 2018-12-23 | Discharge: 2018-12-28 | DRG: 177 | Disposition: A | Discharge status: Home / Self Care | Payer: HRSA Program | Attending: Internal Medicine | Admitting: Internal Medicine

## 2018-12-23 ENCOUNTER — Emergency Department (HOSPITAL_COMMUNITY): Payer: HRSA Program

## 2018-12-23 ENCOUNTER — Other Ambulatory Visit: Payer: Self-pay

## 2018-12-23 DIAGNOSIS — E118 Type 2 diabetes mellitus with unspecified complications: Secondary | ICD-10-CM | POA: Diagnosis not present

## 2018-12-23 DIAGNOSIS — Z6829 Body mass index (BMI) 29.0-29.9, adult: Secondary | ICD-10-CM

## 2018-12-23 DIAGNOSIS — E1165 Type 2 diabetes mellitus with hyperglycemia: Secondary | ICD-10-CM | POA: Diagnosis not present

## 2018-12-23 DIAGNOSIS — J9601 Acute respiratory failure with hypoxia: Secondary | ICD-10-CM | POA: Diagnosis present

## 2018-12-23 DIAGNOSIS — U071 COVID-19: Principal | ICD-10-CM | POA: Diagnosis present

## 2018-12-23 DIAGNOSIS — Z833 Family history of diabetes mellitus: Secondary | ICD-10-CM | POA: Diagnosis not present

## 2018-12-23 DIAGNOSIS — G4700 Insomnia, unspecified: Secondary | ICD-10-CM | POA: Diagnosis present

## 2018-12-23 DIAGNOSIS — R0789 Other chest pain: Secondary | ICD-10-CM

## 2018-12-23 DIAGNOSIS — Z8249 Family history of ischemic heart disease and other diseases of the circulatory system: Secondary | ICD-10-CM | POA: Diagnosis not present

## 2018-12-23 DIAGNOSIS — T380X5A Adverse effect of glucocorticoids and synthetic analogues, initial encounter: Secondary | ICD-10-CM | POA: Diagnosis not present

## 2018-12-23 DIAGNOSIS — J1282 Pneumonia due to coronavirus disease 2019: Secondary | ICD-10-CM

## 2018-12-23 DIAGNOSIS — Z7984 Long term (current) use of oral hypoglycemic drugs: Secondary | ICD-10-CM

## 2018-12-23 DIAGNOSIS — Z87891 Personal history of nicotine dependence: Secondary | ICD-10-CM

## 2018-12-23 DIAGNOSIS — E669 Obesity, unspecified: Secondary | ICD-10-CM | POA: Diagnosis present

## 2018-12-23 DIAGNOSIS — J1289 Other viral pneumonia: Secondary | ICD-10-CM | POA: Diagnosis present

## 2018-12-23 LAB — HEMOGLOBIN A1C
Hgb A1c MFr Bld: 8.7 % — ABNORMAL HIGH (ref 4.8–5.6)
Mean Plasma Glucose: 202.99 mg/dL

## 2018-12-23 LAB — COMPREHENSIVE METABOLIC PANEL
ALT: 16 U/L (ref 0–44)
AST: 49 U/L — ABNORMAL HIGH (ref 15–41)
Albumin: 3.5 g/dL (ref 3.5–5.0)
Alkaline Phosphatase: 58 U/L (ref 38–126)
Anion gap: 12 (ref 5–15)
BUN: 12 mg/dL (ref 6–20)
CO2: 24 mmol/L (ref 22–32)
Calcium: 8.6 mg/dL — ABNORMAL LOW (ref 8.9–10.3)
Chloride: 99 mmol/L (ref 98–111)
Creatinine, Ser: 1 mg/dL (ref 0.61–1.24)
GFR calc Af Amer: >60 mL/min (ref >60)
GFR calc non Af Amer: >60 mL/min (ref >60)
Glucose, Bld: 115 mg/dL — ABNORMAL HIGH (ref 70–99)
Potassium: 3.7 mmol/L (ref 3.5–5.1)
Sodium: 135 mmol/L (ref 135–145)
Total Bilirubin: 1.3 mg/dL — ABNORMAL HIGH (ref 0.3–1.2)
Total Protein: 7.1 g/dL (ref 6.5–8.1)

## 2018-12-23 LAB — LACTATE DEHYDROGENASE: LDH: 578 U/L — ABNORMAL HIGH (ref 98–192)

## 2018-12-23 LAB — CBC WITH DIFFERENTIAL/PLATELET
Abs Immature Granulocytes: 0 10*3/uL (ref 0.00–0.07)
Basophils Absolute: 0 10*3/uL (ref 0.0–0.1)
Basophils Relative: 0 %
Eosinophils Absolute: 0 10*3/uL (ref 0.0–0.5)
Eosinophils Relative: 0 %
HCT: 42.8 % (ref 39.0–52.0)
Hemoglobin: 14.9 g/dL (ref 13.0–17.0)
Lymphocytes Relative: 11 %
Lymphs Abs: 0.4 10*3/uL — ABNORMAL LOW (ref 0.7–4.0)
MCH: 31.4 pg (ref 26.0–34.0)
MCHC: 34.8 g/dL (ref 30.0–36.0)
MCV: 90.3 fL (ref 80.0–100.0)
Monocytes Absolute: 0.2 10*3/uL (ref 0.1–1.0)
Monocytes Relative: 7 %
Neutro Abs: 2.9 10*3/uL (ref 1.7–7.7)
Neutrophils Relative %: 82 %
Platelets: 148 10*3/uL — ABNORMAL LOW (ref 150–400)
RBC: 4.74 MIL/uL (ref 4.22–5.81)
RDW: 12.1 % (ref 11.5–15.5)
WBC: 3.5 10*3/uL — ABNORMAL LOW (ref 4.0–10.5)
nRBC: 0 % (ref 0.0–0.2)
nRBC: 0 /100 WBC

## 2018-12-23 LAB — GLUCOSE, CAPILLARY: Glucose-Capillary: 174 mg/dL — ABNORMAL HIGH (ref 70–99)

## 2018-12-23 LAB — LIPASE, BLOOD: Lipase: 22 U/L (ref 11–51)

## 2018-12-23 LAB — TROPONIN I (HIGH SENSITIVITY)
Troponin I (High Sensitivity): 8 ng/L (ref <18)
Troponin I (High Sensitivity): 8 ng/L (ref <18)

## 2018-12-23 LAB — FERRITIN: Ferritin: 714 ng/mL — ABNORMAL HIGH (ref 24–336)

## 2018-12-23 LAB — LACTIC ACID, PLASMA: Lactic Acid, Venous: 1.3 mmol/L (ref 0.5–1.9)

## 2018-12-23 LAB — HIV ANTIBODY (ROUTINE TESTING W REFLEX): HIV Screen 4th Generation wRfx: NONREACTIVE

## 2018-12-23 LAB — C-REACTIVE PROTEIN: CRP: 10.2 mg/dL — ABNORMAL HIGH (ref <1.0)

## 2018-12-23 LAB — TRIGLYCERIDES: Triglycerides: 92 mg/dL (ref <150)

## 2018-12-23 LAB — PROCALCITONIN: Procalcitonin: <0.1 ng/mL

## 2018-12-23 LAB — ABO/RH: ABO/RH(D): O POS

## 2018-12-23 MED ORDER — HYDROCOD POLST-CPM POLST ER 10-8 MG/5ML PO SUER
5.0000 mL | Freq: Once | ORAL | Status: AC
  Administered 2018-12-23: 5 mL via ORAL
  Filled 2018-12-23: qty 5

## 2018-12-23 MED ORDER — ALBUTEROL SULFATE HFA 108 (90 BASE) MCG/ACT IN AERS
2.0000 | INHALATION_SPRAY | Freq: Four times a day (QID) | RESPIRATORY_TRACT | Status: DC
  Administered 2018-12-23 – 2018-12-27 (×15): 2 via RESPIRATORY_TRACT
  Filled 2018-12-23: qty 6.7

## 2018-12-23 MED ORDER — ONDANSETRON HCL 4 MG PO TABS: 4.0000 mg | ORAL_TABLET | Freq: Four times a day (QID) | ORAL | Status: DC | PRN

## 2018-12-23 MED ORDER — DEXAMETHASONE 6 MG PO TABS
6.0000 mg | ORAL_TABLET | ORAL | Status: DC
  Administered 2018-12-23 – 2018-12-27 (×5): 6 mg via ORAL
  Filled 2018-12-23: qty 1
  Filled 2018-12-23: qty 2
  Filled 2018-12-23 (×3): qty 1

## 2018-12-23 MED ORDER — GUAIFENESIN-DM 100-10 MG/5ML PO SYRP
10.0000 mL | ORAL_SOLUTION | ORAL | Status: DC | PRN
  Administered 2018-12-24: 10 mL via ORAL
  Filled 2018-12-23: qty 10

## 2018-12-23 MED ORDER — ALBUTEROL SULFATE HFA 108 (90 BASE) MCG/ACT IN AERS: 2.0000 | INHALATION_SPRAY | Freq: Four times a day (QID) | RESPIRATORY_TRACT | Status: DC

## 2018-12-23 MED ORDER — HYDROCOD POLST-CPM POLST ER 10-8 MG/5ML PO SUER: 5.0000 mL | Freq: Two times a day (BID) | ORAL | Status: DC | PRN

## 2018-12-23 MED ORDER — SODIUM CHLORIDE 0.9 % IV SOLN
INTRAVENOUS | Status: DC
  Administered 2018-12-23 – 2018-12-24 (×3): via INTRAVENOUS

## 2018-12-23 MED ORDER — ONDANSETRON HCL 4 MG/2ML IJ SOLN: 4.0000 mg | Freq: Four times a day (QID) | INTRAMUSCULAR | Status: DC | PRN

## 2018-12-23 MED ORDER — KETOROLAC TROMETHAMINE 15 MG/ML IJ SOLN
15.0000 mg | Freq: Once | INTRAMUSCULAR | Status: AC
  Administered 2018-12-23: 15 mg via INTRAVENOUS
  Filled 2018-12-23: qty 1

## 2018-12-23 MED ORDER — INSULIN ASPART 100 UNIT/ML ~~LOC~~ SOLN
0.0000 [IU] | Freq: Three times a day (TID) | SUBCUTANEOUS | Status: DC
  Administered 2018-12-24: 5 [IU] via SUBCUTANEOUS
  Administered 2018-12-24: 8 [IU] via SUBCUTANEOUS
  Administered 2018-12-24: 5 [IU] via SUBCUTANEOUS
  Administered 2018-12-25: 3 [IU] via SUBCUTANEOUS
  Administered 2018-12-25 – 2018-12-26 (×4): 5 [IU] via SUBCUTANEOUS
  Administered 2018-12-27 – 2018-12-28 (×3): 3 [IU] via SUBCUTANEOUS

## 2018-12-23 MED ORDER — ONDANSETRON HCL 4 MG/2ML IJ SOLN
4.0000 mg | Freq: Once | INTRAMUSCULAR | Status: AC
  Administered 2018-12-23: 16:00:00 4 mg via INTRAVENOUS
  Filled 2018-12-23: qty 2

## 2018-12-23 MED ORDER — ENOXAPARIN SODIUM 40 MG/0.4ML ~~LOC~~ SOLN
40.0000 mg | SUBCUTANEOUS | Status: DC
  Administered 2018-12-23 – 2018-12-27 (×5): 40 mg via SUBCUTANEOUS
  Filled 2018-12-23 (×5): qty 0.4

## 2018-12-23 MED ORDER — INSULIN ASPART 100 UNIT/ML ~~LOC~~ SOLN
0.0000 [IU] | Freq: Every day | SUBCUTANEOUS | Status: DC
  Administered 2018-12-24: 3 [IU] via SUBCUTANEOUS
  Administered 2018-12-25: 2 [IU] via SUBCUTANEOUS

## 2018-12-23 MED ORDER — ACETAMINOPHEN 325 MG PO TABS: 650.0000 mg | ORAL_TABLET | Freq: Four times a day (QID) | ORAL | Status: DC | PRN

## 2018-12-23 MED ORDER — TOCILIZUMAB 400 MG/20ML IV SOLN: 8.0000 mg/kg | Freq: Once | INTRAVENOUS | Status: DC

## 2018-12-23 MED ORDER — ONDANSETRON 4 MG PO TBDP: 4.0000 mg | ORAL_TABLET | Freq: Three times a day (TID) | ORAL | 0 refills | Status: DC | PRN

## 2018-12-23 MED ORDER — SODIUM CHLORIDE 0.9 % IV SOLN
200.0000 mg | Freq: Once | INTRAVENOUS | Status: AC
  Administered 2018-12-23: 19:00:00 200 mg via INTRAVENOUS
  Filled 2018-12-23: qty 40

## 2018-12-23 MED ORDER — BENZONATATE 100 MG PO CAPS: 100.0000 mg | ORAL_CAPSULE | Freq: Three times a day (TID) | ORAL | 0 refills | Status: DC

## 2018-12-23 NOTE — ED Notes (Addendum)
Lab stated they could add on Procal and A1c to previous collection.

## 2018-12-23 NOTE — ED Provider Notes (Signed)
Assumed care of patient at 3 PM.  Patient here with cough, chest pain.  Recently tested positive for coronavirus 3 days ago.  Found out about positive test today.  Has had shortness of breath.  Lab work and chest x-ray pending.  Appears to have some tachypnea.  No fever currently.  Upon my evaluation ambulated the patient in the room and he became increasingly more tachypneic in the 40s.  Hypoxic to 85% on room air.  Placed on 2 L of oxygen.  Chest x-ray shows findings consistent with a multifocal pneumonia likely viral.  Likely from his coronavirus.  Otherwise blood pressure is unremarkable.  We will get coronavirus screening labs and admit for further care given hypoxia, tachypnea in the setting of coronavirus.  EKG shows sinus rhythm.  No ischemic changes.  Awaiting lab work and then will admit for further care.  White count 3.5.  Otherwise no significant anemia, electrolyte abnormality.  Given hypoxia, Covid positive, viral pneumonia and will admit for further care.  Hemodynamically stable on 2 L of oxygen throughout my care.  This chart was dictated using voice recognition software.  Despite best efforts to proofread,  errors can occur which can change the documentation meaning.  Jacob Durham was evaluated in Emergency Department on 12/23/2018 for the symptoms described in the history of present illness. He was evaluated in the context of the global COVID-19 pandemic, which necessitated consideration that the patient might be at risk for infection with the SARS-CoV-2 virus that causes COVID-19. Institutional protocols and algorithms that pertain to the evaluation of patients at risk for COVID-19 are in a state of rapid change based on information released by regulatory bodies including the CDC and federal and state organizations. These policies and algorithms were followed during the patient's care in the ED.   .Critical Care Performed by: Lennice Sites, DO Authorized by: Lennice Sites, DO    Critical care provider statement:    Critical care time (minutes):  35   Critical care was necessary to treat or prevent imminent or life-threatening deterioration of the following conditions:  Respiratory failure   Critical care was time spent personally by me on the following activities:  Blood draw for specimens, development of treatment plan with patient or surrogate, discussions with primary provider, evaluation of patient's response to treatment, examination of patient, obtaining history from patient or surrogate, ordering and performing treatments and interventions, ordering and review of laboratory studies, ordering and review of radiographic studies, pulse oximetry, re-evaluation of patient's condition and review of old charts      Lennice Sites, DO 12/23/18 1706

## 2018-12-23 NOTE — ED Notes (Signed)
Per lab, there isn't enough blood in blue top for d-dimer, will place order and have re-drawn.

## 2018-12-23 NOTE — H&P (Signed)
History and Physical    Jacob Durham:[id number] DOB: 02/08/1961 DOA: 12/23/2018  PCP: System, Pcp Not In  Patient coming from: Home  I have personally briefly reviewed patient's old medical records in [facility]  Chief Complaint: Body ache and shortness of breath  HPI: Jacob Durham is a 58 y.o. male with medical history significant of type 2 diabetes mellitus and hyperlipidemia presented to ED with a complaint of body ache and shortness of breath.  Apparently, patient initially presented to ED on 12/20/2018 with a complaint of passing out.  He had associated diarrhea as well.  He was treated in ER, tested for COVID-19 and sent home.  He received his test results for COVID-19 today and he was told that he was positive.  He started having more shortness of breath so he came to the ER.  He also has some productive cough with yellow sputum.  No fever noted.  He complains of pain all over the body and legs, arms, chest, abdomen and neck.  He denies any exposure to COVID-19 and claims that he wears mask at social places however he works in [employer].   ED Course: Upon arrival to ED, his initial blood pressure was 85/45.  He was tachypneic.  He dropped his oxygen saturation to 85% on room air and required 2 L of oxygen.  EKG did not show any acute ST-T wave changes.  White cells of 3.5.  Chest x-ray showed multifocal pneumonia.  Hospital service was consulted to admit the patient for further management.  Review of Systems: As per HPI otherwise negative.    Past Medical History:  Diagnosis Date  . Chronic back pain   . Diabetes mellitus   . Diabetic cataract(366.41)   . Erectile dysfunction   . Hyperlipidemia     Past Surgical History:  Procedure Laterality Date  . CATARACT EXTRACTION  2007     reports that he quit smoking about 17 years ago. His smoking use included cigarettes. He has a 1.25 pack-year smoking history. He does not have any smokeless tobacco history on file. He  reports current alcohol use of about 14.0 standard drinks of alcohol per week. He reports that he does not use drugs.  No Known Allergies  Family History  Problem Relation Age of Onset  . Diabetes Maternal Grandmother   . Hypertension Maternal Grandmother     Prior to Admission medications   Medication Sig Start Date End Date Taking? Authorizing Provider  benzonatate (TESSALON) 100 MG capsule Take 1 capsule (100 mg total) by mouth every 8 (eight) hours. 12/23/18   Deno Etienne, DO  ondansetron (ZOFRAN ODT) 4 MG disintegrating tablet Take 1 tablet (4 mg total) by mouth every 8 (eight) hours as needed for nausea or vomiting. 12/23/18   Deno Etienne, DO    Physical Exam: Vitals:   12/23/18 1415 12/23/18 1430 12/23/18 1515 12/23/18 1530  BP: (!) 144/90 136/80 132/78 123/75  Pulse: 83 84 89 92  Resp: (!) 28 (!) 28 (!) 31 (!) 21  Temp:      TempSrc:      SpO2: 91% 93% 94% 97%    Constitutional: NAD, calm, comfortable Vitals:   12/23/18 1415 12/23/18 1430 12/23/18 1515 12/23/18 1530  BP: (!) 144/90 136/80 132/78 123/75  Pulse: 83 84 89 92  Resp: (!) 28 (!) 28 (!) 31 (!) 21  Temp:      TempSrc:      SpO2: 91% 93% 94% 97%  Eyes: PERRL, lids and conjunctivae normal ENMT: Mucous membranes are moist. Posterior pharynx clear of any exudate or lesions.Normal dentition.  Neck: normal, supple, no masses, no thyromegaly Respiratory: Rhonchi bilaterally. Normal respiratory effort. No accessory muscle use.  Cardiovascular: Regular rate and rhythm, no murmurs / rubs / gallops. No extremity edema. 2+ pedal pulses. No carotid bruits.  Abdomen: no tenderness, no masses palpated. No hepatosplenomegaly. Bowel sounds positive.  Musculoskeletal: no clubbing / cyanosis. No joint deformity upper and lower extremities. Good ROM, no contractures. Normal muscle tone.  Skin: no rashes, lesions, ulcers. No induration Neurologic: CN 2-12 grossly intact. Sensation intact, DTR normal. Strength 5/5 in all 4.   Psychiatric: Normal judgment and insight. Alert and oriented x 3. Normal mood.    Labs on Admission: I have personally reviewed following labs and imaging studies  CBC: Recent Labs  Lab 12/20/18 0503 12/23/18 1454  WBC 3.7* 3.5*  NEUTROABS 2.3 2.9  HGB 14.6 14.9  HCT 42.3 42.8  MCV 93.0 90.3  PLT 120* 148*   Basic Metabolic Panel: Recent Labs  Lab 12/20/18 0503 12/23/18 1454  NA 135 135  K 3.9 3.7  CL 102 99  CO2 23 24  GLUCOSE 124* 115*  BUN 12 12  CREATININE 1.16 1.00  CALCIUM 8.5* 8.6*   GFR: Estimated Creatinine Clearance: 82.7 mL/min (by C-G formula based on SCr of 1 mg/dL). Liver Function Tests: Recent Labs  Lab 12/20/18 0503 12/23/18 1454  AST 26 49*  ALT 18 16  ALKPHOS 62 58  BILITOT 1.0 1.3*  PROT 7.0 7.1  ALBUMIN 3.8 3.5   Recent Labs  Lab 12/23/18 1505  LIPASE 22   No results for input(s): AMMONIA in the last 168 hours. Coagulation Profile: No results for input(s): INR, PROTIME in the last 168 hours. Cardiac Enzymes: No results for input(s): CKTOTAL, CKMB, CKMBINDEX, TROPONINI in the last 168 hours. BNP (last 3 results) No results for input(s): PROBNP in the last 8760 hours. HbA1C: No results for input(s): HGBA1C in the last 72 hours. CBG: Recent Labs  Lab 12/20/18 0504  GLUCAP 104*   Lipid Profile: Recent Labs    12/23/18 1620  TRIG 92   Thyroid Function Tests: No results for input(s): TSH, T4TOTAL, FREET4, T3FREE, THYROIDAB in the last 72 hours. Anemia Panel: No results for input(s): VITAMINB12, FOLATE, FERRITIN, TIBC, IRON, RETICCTPCT in the last 72 hours. Urine analysis:    Component Value Date/Time   COLORURINE YELLOW 12/20/2018 0651   APPEARANCEUR CLEAR 12/20/2018 0651   LABSPEC 1.016 12/20/2018 0651   PHURINE 5.0 12/20/2018 0651   GLUCOSEU NEGATIVE 12/20/2018 0651   GLUCOSEU NEG mg/dL 40/98/119111/06/2008 [id number]   HGBUR SMALL (A) 12/20/2018 0651   BILIRUBINUR NEGATIVE 12/20/2018 0651   KETONESUR 20 (A) 12/20/2018 0651    PROTEINUR 30 (A) 12/20/2018 0651   UROBILINOGEN 0.2 06/19/2013 1004   NITRITE NEGATIVE 12/20/2018 0651   LEUKOCYTESUR NEGATIVE 12/20/2018 0651    Radiological Exams on Admission: Dg Chest Port 1 View  Result Date: 12/23/2018 CLINICAL DATA:  Cough, chest pain EXAM: PORTABLE CHEST 1 VIEW COMPARISON:  Radiograph Jul 07, 2016 FINDINGS: Multifocal patchy areas of airspace opacity throughout the lungs most pronounced in the left lung base. No pneumothorax or effusion. Cardiomediastinal contours are unremarkable. No acute osseous or soft tissue abnormality. IMPRESSION: Findings compatible with multifocal pneumonia including atypical viral etiologies such as COVID-19. Electronically Signed   By: Kreg ShropshirePrice  DeHay M.D.   On: 12/23/2018 15:27    EKG: Independently reviewed.  Sinus  rhythm with no acute ST-T wave changes  Assessment/Plan Active Problems:   Type II diabetes mellitus with manifestations (HCC)   COVID-19 virus infection   Acute hypoxic respiratory failure secondary to COVID-19 infection: Patient is hypoxic, positive chest x-ray findings and thus he meets criteria for remdesivir as well as Actemra and dexamethasone.  I discussed treating his COVID-19 infection with all 3 medications.  He was also told that Actemra is an off label use for COVID-19 infection.  He denied any history of tuberculosis, hepatitis and understood the risk and benefits and wants to proceed with Actemra treatment if required.  I have consulted pharmacy for Actemra and remdesivir and have started him on dexamethasone 6 mg p.o. daily for 10 days.  His inflammatory markers are still pending includes D-dimer.  If D-dimer is elevated, I would prefer he gets CT angiogram to rule out PE before he goes to [facility].  Type 2 diabetes mellitus: He takes Metformin at home.  Does not remember the dose.  Does not remember his last hemoglobin A1c.  Has not seen physician in a long time.  We will start him on SSI.  DVT  prophylaxis: Lovenox/SCD Code Status: Full code Family Communication: None present at bedside.  Plan of care discussed with patient in length and he verbalized understanding and agreed with it. Disposition Plan: To be determined Consults called: None Admission status: Admit as inpatient to [facility]   Hughie Closs MD [facility]  12/23/2018, 5:34 PM  To contact the attending provider between 7A-7P or the covering provider during after hours 7P-7A, please log into the web site [email or web address] and use password [email or web address].

## 2018-12-23 NOTE — ED Provider Notes (Signed)
[facility] EMERGENCY DEPARTMENT Provider Note   CSN: [id number] Arrival date & time: 12/23/18  1338     History   Chief Complaint Chief Complaint  Patient presents with   Chest Pain   Cough   Nausea    HPI Jacob Durham is a 58 y.o. male.     58 yo M with a chief complaints of chest pain.  Patient states that the substernal area of his chest hurts whenever he coughs sneezes or throws up.  He has had a significant amount of coughing and sneezing and vomiting since he was diagnosed with the novel coronavirus he has not been taking any medications other than Robitussin.  States that he continues to having myalgias and subjective fevers and chills.  Mild shortness of breath.  The history is provided by the patient.  Illness Severity:  Moderate Onset quality:  Gradual Duration:  4 days Timing:  Constant Progression:  Worsening Chronicity:  New Associated symptoms: chest pain, congestion, cough, diarrhea, fever, myalgias, nausea and vomiting   Associated symptoms: no abdominal pain, no headaches, no rash and no shortness of breath     Past Medical History:  Diagnosis Date   Chronic back pain    Diabetes mellitus    Diabetic cataract(366.41)    Erectile dysfunction    Hyperlipidemia     Patient Active Problem List   Diagnosis Date Noted   Urticaria 01/04/2015   Health care maintenance 11/06/2011   HLD (hyperlipidemia) 09/27/2008   ERECTILE DYSFUNCTION, NON-ORGANIC 09/27/2008   Diabetic cataract (HCC) 09/27/2008   BACK PAIN, CHRONIC 09/27/2008   Type II diabetes mellitus with manifestations (HCC) 03/06/1999    Past Surgical History:  Procedure Laterality Date   CATARACT EXTRACTION  2007        Home Medications    Prior to Admission medications   Medication Sig Start Date End Date Taking? Authorizing Provider  aspirin 81 MG EC tablet Take 1 tablet (81 mg total) by mouth daily. With meal Patient not taking: Reported on  12/20/2018 11/06/11   Ignacia Palma, MD  atorvastatin (LIPITOR) 20 MG tablet Take 1 tablet (20 mg total) by mouth daily. Patient not taking: Reported on 12/20/2018 01/04/15 12/20/27  Courtney Paris, MD  benzonatate (TESSALON) 100 MG capsule Take 1 capsule (100 mg total) by mouth every 8 (eight) hours. 12/23/18   Melene Plan, DO  clotrimazole-betamethasone (LOTRISONE) cream Apply 1 application topically 2 (two) times daily. For the rash on your foot Patient not taking: Reported on 12/20/2018 06/19/13   Mancel Bale, MD  diphenhydrAMINE (BENADRYL) 25 MG tablet Take 1 tablet (25 mg total) by mouth every 6 (six) hours. Patient not taking: Reported on 12/20/2018 07/02/15   Melton Krebs, PA-C  glipiZIDE (GLUCOTROL XL) 10 MG 24 hr tablet TAKE 1 TABLET (5 MG TOTAL) BY MOUTH DAILY. Patient not taking: Reported on 12/20/2018 01/04/15   Courtney Paris, MD  Guaifenesin 1200 MG TB12 Take 1 tablet (1,200 mg total) by mouth 2 (two) times daily. Patient not taking: Reported on 12/20/2018 07/07/16   Charlestine Night, PA-C  hydrOXYzine (ATARAX/VISTARIL) 25 MG tablet Take 1 tablet (25 mg total) by mouth every 6 (six) hours. Patient not taking: Reported on 12/20/2018 01/04/15   Courtney Paris, MD  Lancets MISC Use to test blood sugar every other morning before eating 11/06/11   Ignacia Palma, MD  metFORMIN (GLUCOPHAGE) 1000 MG tablet Take 1 tablet (1,000 mg total) by mouth 2 (two) times  daily with a meal. Patient not taking: Reported on 12/20/2018 01/04/15   Courtney Paris, MD  ondansetron (ZOFRAN ODT) 4 MG disintegrating tablet Take 1 tablet (4 mg total) by mouth every 8 (eight) hours as needed for nausea or vomiting. 12/23/18   Melene Plan, DO  permethrin (ELIMITE) 5 % cream Apply to affected area once, wash off after 8-14 hours, then repeat in 1 week. Patient not taking: Reported on 12/20/2018 07/02/15   Melton Krebs, PA-C  predniSONE (STERAPRED UNI-PAK 21 TAB) 10 MG (21) TBPK tablet Take 1 tablet  (10 mg total) by mouth daily. Take 6 tabs by mouth daily  for 2 days, then 5 tabs for 2 days, then 4 tabs for 2 days, then 3 tabs for 2 days, 2 tabs for 2 days, then 1 tab by mouth daily for 2 days Patient not taking: Reported on 12/20/2018 07/02/15   Melton Krebs, PA-C  promethazine-dextromethorphan (PROMETHAZINE-DM) 6.25-15 MG/5ML syrup Take 5 mLs by mouth 4 (four) times daily as needed for cough. Patient not taking: Reported on 12/20/2018 07/07/16   Charlestine Night, PA-C  traMADol (ULTRAM) 50 MG tablet TAKE 1 TABLET BY MOUTH TWICE DAILY AS NEEDED Patient not taking: Reported on 12/20/2018 10/16/13   Courtney Paris, MD  triamcinolone cream (KENALOG) 0.1 % Apply 1 application topically 4 (four) times daily as needed. Patient not taking: Reported on 12/20/2018 12/31/14   Arthor Captain, PA-C    Family History Family History  Problem Relation Age of Onset   Diabetes Maternal Grandmother    Hypertension Maternal Grandmother     Social History Social History   Tobacco Use   Smoking status: Former Smoker    Packs/day: 0.25    Years: 5.00    Pack years: 1.25    Types: Cigarettes    Quit date: 03/07/2001    Years since quitting: 17.8  Substance Use Topics   Alcohol use: Yes    Alcohol/week: 14.0 standard drinks    Types: 14 Cans of beer per week   Drug use: No     Allergies   Patient has no known allergies.   Review of Systems Review of Systems  Constitutional: Positive for fever. Negative for chills.  HENT: Positive for congestion. Negative for facial swelling.   Eyes: Negative for discharge and visual disturbance.  Respiratory: Positive for cough. Negative for shortness of breath.   Cardiovascular: Positive for chest pain. Negative for palpitations.  Gastrointestinal: Positive for diarrhea, nausea and vomiting. Negative for abdominal pain.  Musculoskeletal: Positive for myalgias. Negative for arthralgias.  Skin: Negative for color change and rash.    Neurological: Negative for tremors, syncope and headaches.  Psychiatric/Behavioral: Negative for confusion and dysphoric mood.     Physical Exam Updated Vital Signs BP 132/78    Pulse 89    Temp 99.7 F (37.6 C) (Oral)    Resp (!) 31    SpO2 94%   Physical Exam Vitals signs and nursing note reviewed.  Constitutional:      Appearance: He is well-developed.  HENT:     Head: Normocephalic and atraumatic.  Eyes:     Pupils: Pupils are equal, round, and reactive to light.  Neck:     Musculoskeletal: Normal range of motion and neck supple.     Vascular: No JVD.  Cardiovascular:     Rate and Rhythm: Normal rate and regular rhythm.     Heart sounds: No murmur. No friction rub. No gallop.   Pulmonary:  Effort: No respiratory distress.     Breath sounds: No wheezing.  Chest:     Chest wall: Tenderness present.     Comments: Palpation of the anterior chest wall along the sternum reproduces the patient's pain Abdominal:     General: There is no distension.     Tenderness: There is no guarding or rebound.  Musculoskeletal: Normal range of motion.  Skin:    Coloration: Skin is not pale.     Findings: No rash.  Neurological:     Mental Status: He is alert and oriented to person, place, and time.  Psychiatric:        Behavior: Behavior normal.      ED Treatments / Results  Labs (all labs ordered are listed, but only abnormal results are displayed) Labs Reviewed  CULTURE, BLOOD (ROUTINE X 2)  CULTURE, BLOOD (ROUTINE X 2)  CBC WITH DIFFERENTIAL/PLATELET  COMPREHENSIVE METABOLIC PANEL  LIPASE, BLOOD  LACTIC ACID, PLASMA  LACTIC ACID, PLASMA  D-DIMER, QUANTITATIVE (NOT AT [facility])  PROCALCITONIN  LACTATE DEHYDROGENASE  FERRITIN  TRIGLYCERIDES  FIBRINOGEN  C-REACTIVE PROTEIN  TROPONIN I (HIGH SENSITIVITY)    EKG EKG Interpretation  Date/Time:  Tuesday December 23 2018 14:03:29 EDT Ventricular Rate:  83 PR Interval:    QRS Duration: 80 QT Interval:  351 QTC  Calculation: 413 R Axis:   72 Text Interpretation:  Sinus rhythm Confirmed by Lennice Sites [phone]) on 12/23/2018 3:15:24 PM   Radiology Dg Chest Port 1 View  Result Date: 12/23/2018 CLINICAL DATA:  Cough, chest pain EXAM: PORTABLE CHEST 1 VIEW COMPARISON:  Radiograph Jul 07, 2016 FINDINGS: Multifocal patchy areas of airspace opacity throughout the lungs most pronounced in the left lung base. No pneumothorax or effusion. Cardiomediastinal contours are unremarkable. No acute osseous or soft tissue abnormality. IMPRESSION: Findings compatible with multifocal pneumonia including atypical viral etiologies such as COVID-19. Electronically Signed   By: Lovena Le M.D.   On: 12/23/2018 15:27    Procedures Procedures (including critical care time)  Medications Ordered in ED Medications  chlorpheniramine-HYDROcodone (TUSSIONEX) 10-8 MG/5ML suspension 5 mL (5 mLs Oral Given 12/23/18 1538)  ondansetron (ZOFRAN) injection 4 mg (4 mg Intravenous Given 12/23/18 1538)  ketorolac (TORADOL) 15 MG/ML injection 15 mg (15 mg Intravenous Given 12/23/18 1538)     Initial Impression / Assessment and Plan / ED Course  I have reviewed the triage vital signs and the nursing notes.  Pertinent labs & imaging results that were available during my care of the patient were reviewed by me and considered in my medical decision making (see chart for details).        58 yo M with a chief complaints of chest pain.  This is likely musculoskeletal from his novel coronavirus.  He had 1 documented oxygen saturation in the 80s though in the room the patient's oxygen saturation is about 95 he is well-appearing and nontoxic.  Appears well-hydrated.  Mildly tachypneic.  Will obtain a laboratory evaluation repeat chest x-ray give a fluid bolus treat his cough and nausea and reassess.  Patient care was signed out to Dr. Ronnald Nian please see his note for further details care in the ED.  Jacob Durham was evaluated in Emergency  Department on 12/23/2018 for the symptoms described in the history of present illness. He/she was evaluated in the context of the global COVID-19 pandemic, which necessitated consideration that the patient might be at risk for infection with the SARS-CoV-2 virus that causes COVID-19. Institutional protocols and algorithms  that pertain to the evaluation of patients at risk for COVID-19 are in a state of rapid change based on information released by regulatory bodies including the CDC and federal and state organizations. These policies and algorithms were followed during the patient's care in the ED.   The patients results and plan were reviewed and discussed.   Any x-rays performed were independently reviewed by myself.   Differential diagnosis were considered with the presenting HPI.  Medications  chlorpheniramine-HYDROcodone (TUSSIONEX) 10-8 MG/5ML suspension 5 mL (5 mLs Oral Given 12/23/18 1538)  ondansetron (ZOFRAN) injection 4 mg (4 mg Intravenous Given 12/23/18 1538)  ketorolac (TORADOL) 15 MG/ML injection 15 mg (15 mg Intravenous Given 12/23/18 1538)    Vitals:   12/23/18 1405 12/23/18 1415 12/23/18 1430 12/23/18 1515  BP:  (!) 144/90 136/80 132/78  Pulse:  83 84 89  Resp:  (!) 28 (!) 28 (!) 31  Temp:      TempSrc:      SpO2: 91% 91% 93% 94%    Final diagnoses:  COVID-19 virus infection  Chest pain, atypical    Admission/ observation were discussed with the admitting physician, patient and/or family and they are comfortable with the plan.    Final Clinical Impressions(s) / ED Diagnoses   Final diagnoses:  COVID-19 virus infection  Chest pain, atypical    ED Discharge Orders         Ordered    ondansetron (ZOFRAN ODT) 4 MG disintegrating tablet  Every 8 hours PRN     12/23/18 1503    benzonatate (TESSALON) 100 MG capsule  Every 8 hours     12/23/18 1503           Melene PlanFloyd, Kamarius Buckbee, DO 12/23/18 1552

## 2018-12-23 NOTE — ED Notes (Signed)
This nurse was drawing labs when [facility] MD came in to speak to pt. MD had pt ambulate in place in room and assessed desaturation. MD instructed this nurse to put pt on 2L O2 via [place]. This nurse complied, will continue to monitor.

## 2018-12-23 NOTE — ED Notes (Signed)
[facility]/Transfer to [facility] called @ 1815-per Lonn Georgia, RN called by Levada Dy

## 2018-12-23 NOTE — ED Notes (Addendum)
This nurse attempted report, was given direct number for RN Marzetta Board who will be assuming care at [facility] [phone]. Will attempt report again in 43min

## 2018-12-23 NOTE — Progress Notes (Addendum)
MEDICATION RELATED CONSULT NOTE - INITIAL   Pharmacy Consult for Remdesevir Indication: COVID + requiring O2, CRP 10.2  No Known Allergies   Vital Signs: Temp: 99.7 F (37.6 C) (10/20 1344) Temp Source: Oral (10/20 1344) BP: 123/75 (10/20 1530) Pulse Rate: 92 (10/20 1530) Intake/Output from previous day: No intake/output data recorded. Intake/Output from this shift: No intake/output data recorded.  Labs: Recent Labs    12/23/18 1454  WBC 3.5*  HGB 14.9  HCT 42.8  PLT 148*  CREATININE 1.00  ALBUMIN 3.5  PROT 7.1  AST 49*  ALT 16  ALKPHOS 58  BILITOT 1.3*   Estimated Creatinine Clearance: 82.7 mL/min (by C-G formula based on SCr of 1 mg/dL).  CXR: Multifocal patchy areas of airspace opacity throughout the lungs most pronounced in the left lung base  Microbiology: Recent Results (from the past 720 hour(s))  SARS CORONAVIRUS 2 (TAT 6-24 HRS) Nasopharyngeal Nasopharyngeal Swab     Status: Abnormal   Collection Time: 12/20/18  5:23 AM   Specimen: Nasopharyngeal Swab  Result Value Ref Range Status   SARS Coronavirus 2 POSITIVE (A) NEGATIVE Final    Comment: RESULT CALLED TO, READ BACK BY AND VERIFIED WITH: JAMIE BLUE RN.@[email or web address]  ON 10.17.2020 BY TCALDWELL MT. (NOTE) SARS-CoV-2 target nucleic acids are DETECTED. The SARS-CoV-2 RNA is generally detectable in upper and lower respiratory specimens during the acute phase of infection. Positive results are indicative of active infection with SARS-CoV-2. Clinical  correlation with patient history and other diagnostic information is necessary to determine patient infection status. Positive results do  not rule out bacterial infection or co-infection with other viruses. The expected result is Negative. Fact Sheet for Patients: SugarRoll.be Fact Sheet for Healthcare Providers: https://www.woods-mathews.com/ This test is not yet approved or cleared by the [place] FDA and  has been  authorized for detection and/or diagnosis of SARS-CoV-2 by FDA under an Emergency Use Authorization (EUA). This EUA will remain  in effect (meaning this test can  be used) for the duration of the COVID-19 declaration under Section 564(b)(1) of the Act, 21 U.S.C. section 360bbb-3(b)(1), unless the authorization is terminated or revoked sooner. Performed at [facility], [place] [address] [place], [place] [address]     Medical History: Past Medical History:  Diagnosis Date  . Chronic back pain   . Diabetes mellitus   . Diabetic cataract(366.41)   . Erectile dysfunction   . Hyperlipidemia     Assessment:  58 y.o. male with medical history significant of type 2 diabetes mellitus and hyperlipidemia presented to ED with a complaint of body ache and shortness of breath in the setting of COVID-19 positive test on 12/20/18. Patient O2 sat dropped to 85% once on room while in the ED (most other sats >94%) and was put on 2L of O2 with O2 sats up to 99%. Pharmacy was consulted for remdesevir and tocilizumab was also ordered. Spoke with Dr. Doristine Bosworth regarding COVACTA trial results and he agreed with hold tocilizumab for now. Will continue on dexamethasone and remdesevir.   Patient is pending transfer to [facility].   Plan:  -Patient meets criteria for remdesevir  -Start remdesevir 200mg  tonight -If still at [facility] on 10/21, pharmacy will order 100mg  maintenance dose   Benetta Spar, PharmD, BCPS, [facility] Clinical Pharmacist 12/23/2018,6:03 PM

## 2018-12-23 NOTE — ED Notes (Signed)
ED TO INPATIENT HANDOFF REPORT  ED Nurse Name and Phone #: Jinny Blossom, #[phone]  S Name/Age/Gender Jacob Durham 58 y.o. male Room/Bed: 030C/030C  Code Status   Code Status: Full Code  Home/SNF/Other Home Patient oriented to: self, place, time and situation Is this baseline? Yes   Triage Complete: Triage complete  Chief Complaint Severe Chest Pain; N/V; Stomach Pain  Triage Note Pt was seen here on the 17th, is here today because he is experiencing SOB, CP, cough, N/V. Was unaware that his COVID test from the 17th is positive when asked.    Allergies No Known Allergies  Level of Care/Admitting Diagnosis ED Disposition    ED Disposition Condition [facility] Area: [place] [[id number]]  Level of Care: Med-Surg [16]  Covid Evaluation: Confirmed COVID Positive  Diagnosis: COVID-19 virus infection [[id number]]  Admitting Physician: Darliss Cheney [[id number]]  Attending Physician: Darliss Cheney [phone]  Estimated length of stay: 3 - 4 days  Certification:: I certify this patient will need inpatient services for at least 2 midnights  PT Class (Do Not Modify): Inpatient [101]  PT Acc Code (Do Not Modify): Private [1]       B Medical/Surgery History Past Medical History:  Diagnosis Date  . Chronic back pain   . Diabetes mellitus   . Diabetic cataract(366.41)   . Erectile dysfunction   . Hyperlipidemia    Past Surgical History:  Procedure Laterality Date  . CATARACT EXTRACTION  2007     A IV Location/Drains/Wounds Patient Lines/Drains/Airways Status   Active Line/Drains/Airways    Name:   Placement date:   Placement time:   Site:   Days:   Peripheral IV 12/23/18 Right Antecubital   12/23/18    1545    Antecubital   less than 1          Intake/Output Last 24 hours No intake or output data in the 24 hours ending 12/23/18 1833  Labs/Imaging Results for orders placed or performed during the hospital encounter of 12/23/18 (from the  past 48 hour(s))  CBC with Differential     Status: Abnormal   Collection Time: 12/23/18  2:54 PM  Result Value Ref Range   WBC 3.5 (L) 4.0 - 10.5 K/uL   RBC 4.74 4.22 - 5.81 MIL/uL   Hemoglobin 14.9 13.0 - 17.0 g/dL   HCT 42.8 39.0 - 52.0 %   MCV 90.3 80.0 - 100.0 fL   MCH 31.4 26.0 - 34.0 pg   MCHC 34.8 30.0 - 36.0 g/dL   RDW 12.1 11.5 - 15.5 %   Platelets 148 (L) 150 - 400 K/uL   nRBC 0.0 0.0 - 0.2 %   Neutrophils Relative % 82 %   Neutro Abs 2.9 1.7 - 7.7 K/uL   Lymphocytes Relative 11 %   Lymphs Abs 0.4 (L) 0.7 - 4.0 K/uL   Monocytes Relative 7 %   Monocytes Absolute 0.2 0.1 - 1.0 K/uL   Eosinophils Relative 0 %   Eosinophils Absolute 0.0 0.0 - 0.5 K/uL   Basophils Relative 0 %   Basophils Absolute 0.0 0.0 - 0.1 K/uL   nRBC 0 0 /100 WBC   Abs Immature Granulocytes 0.00 0.00 - 0.07 K/uL    Comment: Performed at [facility], [address], [place], [place] [address]  Comprehensive metabolic panel     Status: Abnormal   Collection Time: 12/23/18  2:54 PM  Result Value Ref Range   Sodium 135  135 - 145 mmol/L   Potassium 3.7 3.5 - 5.1 mmol/L   Chloride 99 98 - 111 mmol/L   CO2 24 22 - 32 mmol/L   Glucose, Bld 115 (H) 70 - 99 mg/dL   BUN 12 6 - 20 mg/dL   Creatinine, Ser 2.54 0.61 - 1.24 mg/dL   Calcium 8.6 (L) 8.9 - 10.3 mg/dL   Total Protein 7.1 6.5 - 8.1 g/dL   Albumin 3.5 3.5 - 5.0 g/dL   AST 49 (H) 15 - 41 U/L   ALT 16 0 - 44 U/L   Alkaline Phosphatase 58 38 - 126 U/L   Total Bilirubin 1.3 (H) 0.3 - 1.2 mg/dL   GFR calc non Af Amer >60 >60 mL/min   GFR calc Af Amer >60 >60 mL/min   Anion gap 12 5 - 15    Comment: Performed at [facility], [address], [place], [place] [address]  Troponin I (High Sensitivity)     Status: None   Collection Time: 12/23/18  2:54 PM  Result Value Ref Range   Troponin I (High Sensitivity) 8 <18 ng/L    Comment: (NOTE) Elevated high sensitivity troponin I (hsTnI) values and significant  changes across serial  measurements may suggest ACS but many other  chronic and acute conditions are known to elevate hsTnI results.  Refer to the "Links" section for chest pain algorithms and additional  guidance. Performed at [facility], [address], [place], [place] [address]   Lipase, blood     Status: None   Collection Time: 12/23/18  3:05 PM  Result Value Ref Range   Lipase 22 11 - 51 U/L    Comment: Performed at [facility], [address], [place], [place] [address]  Lactic acid, plasma     Status: None   Collection Time: 12/23/18  4:20 PM  Result Value Ref Range   Lactic Acid, Venous 1.3 0.5 - 1.9 mmol/L    Comment: Performed at [facility], [address] [place], [place] [address]  Lactate dehydrogenase     Status: Abnormal   Collection Time: 12/23/18  4:20 PM  Result Value Ref Range   LDH 578 (H) 98 - 192 U/L    Comment: Performed at [facility], [address], [place], [place] [address]  Ferritin     Status: Abnormal   Collection Time: 12/23/18  4:20 PM  Result Value Ref Range   Ferritin 714 (H) 24 - 336 ng/mL    Comment: Performed at [facility], [address], [place], [place] [address]  Triglycerides     Status: None   Collection Time: 12/23/18  4:20 PM  Result Value Ref Range   Triglycerides 92 <150 mg/dL    Comment: Performed at [facility], [address], [place], [place] [address]  C-reactive protein     Status: Abnormal   Collection Time: 12/23/18  4:20 PM  Result Value Ref Range   CRP 10.2 (H) <1.0 mg/dL    Comment: Performed at [facility], [address], [place], [place] [address]  Troponin I (High Sensitivity)     Status: None   Collection Time: 12/23/18  4:20 PM  Result Value Ref Range   Troponin I (High Sensitivity) 8 <18 ng/L    Comment: (NOTE) Elevated high sensitivity troponin I (hsTnI) values and significant  changes across serial measurements may suggest ACS but many other  chronic and acute conditions are  known to elevate hsTnI results.  Refer to the "Links" section for chest pain algorithms and additional  guidance. Performed at [facility], [address] [place], [place] [address]   Hemoglobin A1c     Status: Abnormal   Collection Time: 12/23/18  5:35 PM  Result Value Ref Range   Hgb A1c MFr Bld 8.7 (H) 4.8 - 5.6 %    Comment: (NOTE) Pre diabetes:          5.7%-6.4% Diabetes:              >6.4% Glycemic control for   <7.0% adults with diabetes    Mean Plasma Glucose 202.99 mg/dL    Comment: Performed at [facility], [address] [place], [place] [address]   Dg Chest Port 1 View  Result Date: 12/23/2018 CLINICAL DATA:  Cough, chest pain EXAM: PORTABLE CHEST 1 VIEW COMPARISON:  Radiograph Jul 07, 2016 FINDINGS: Multifocal patchy areas of airspace opacity throughout the lungs most pronounced in the left lung base. No pneumothorax or effusion. Cardiomediastinal contours are unremarkable. No acute osseous or soft tissue abnormality. IMPRESSION: Findings compatible with multifocal pneumonia including atypical viral etiologies such as COVID-19. Electronically Signed   By: Kreg ShropshirePrice  DeHay M.D.   On: 12/23/2018 15:27    Pending Labs Unresulted Labs (From admission, onward)    Start     Ordered   12/30/18 0500  Creatinine, serum  (enoxaparin (LOVENOX)    CrCl >/= 30 ml/min)  Weekly,   R    Comments: while on enoxaparin therapy    12/23/18 1734   12/24/18 0500  CBC with Differential/Platelet  Daily,   R     12/23/18 1734   12/24/18 0500  Magnesium  Daily,   R     12/23/18 1734   12/24/18 0500  Comprehensive metabolic panel  Daily,   R     12/23/18 1734   12/23/18 1754  Procalcitonin  Add-on,   AD     12/23/18 1753   12/23/18 1754  Hemoglobin A1c  Add-on,   AD    Comments: To assess prior glycemic control    12/23/18 1753   12/23/18 1735  Procalcitonin  Once,   STAT     12/23/18 1735   12/23/18 1732  HIV Antibody (routine testing w rflx)  (HIV Antibody (Routine  testing w reflex) panel)  Once,   STAT     12/23/18 1734   12/23/18 1732  ABO/Rh  Once,   STAT     12/23/18 1734   12/23/18 1732  Influenza panel by PCR (type A & B)  Add-on,   AD     12/23/18 1734   12/23/18 1706  D-dimer, quantitative (not at [facility])  Once,   STAT     12/23/18 1705   12/23/18 1551  Lactic acid, plasma  Now then every 2 hours,   STAT     12/23/18 1551   12/23/18 1551  Blood Culture (routine x 2)  BLOOD CULTURE X 2,   STAT     12/23/18 1551          Vitals/Pain Today's Vitals   12/23/18 1415 12/23/18 1430 12/23/18 1515 12/23/18 1530  BP: (!) 144/90 136/80 132/78 123/75  Pulse: 83 84 89 92  Resp: (!) 28 (!) 28 (!) 31 (!) 21  Temp:      TempSrc:      SpO2: 91% 93% 94% 97%  Isolation Precautions Airborne and Contact precautions  Medications Medications  enoxaparin (LOVENOX) injection 40 mg (40 mg Subcutaneous Given 12/23/18 1818)  0.9 %  sodium chloride infusion ( Intravenous New Bag/Given 12/23/18 1823)  dexamethasone (DECADRON) tablet 6 mg (6 mg Oral Given 12/23/18 1818)  guaiFENesin-dextromethorphan (ROBITUSSIN DM) 100-10 MG/5ML syrup 10 mL (has no administration in time range)  chlorpheniramine-HYDROcodone (TUSSIONEX) 10-8 MG/5ML suspension 5 mL (has no administration in time range)  acetaminophen (TYLENOL) tablet 650 mg (has no administration in time range)  ondansetron (ZOFRAN) tablet 4 mg (has no administration in time range)    Or  ondansetron (ZOFRAN) injection 4 mg (has no administration in time range)  insulin aspart (novoLOG) injection 0-15 Units (has no administration in time range)  insulin aspart (novoLOG) injection 0-5 Units (has no administration in time range)  albuterol (VENTOLIN HFA) 108 (90 Base) MCG/ACT inhaler 2 puff (has no administration in time range)  remdesivir 200 mg in sodium chloride 0.9 % 250 mL IVPB (has no administration in time range)  chlorpheniramine-HYDROcodone (TUSSIONEX) 10-8 MG/5ML suspension 5 mL (5 mLs Oral Given  12/23/18 1538)  ondansetron (ZOFRAN) injection 4 mg (4 mg Intravenous Given 12/23/18 1538)  ketorolac (TORADOL) 15 MG/ML injection 15 mg (15 mg Intravenous Given 12/23/18 1538)    Mobility walks Low fall risk   Focused Assessments Pulmonary Assessment Handoff:  Lung sounds: L Breath Sounds: Diminished R Breath Sounds: Diminished O2 Device: Nasal Cannula O2 Flow Rate (L/min): 2 L/min      R Recommendations: See Admitting Provider Note  Report given to:   Additional Notes:

## 2018-12-23 NOTE — ED Triage Notes (Addendum)
Pt was seen here on the 17th, is here today because he is experiencing SOB, CP, cough, N/V. Was unaware that his COVID test from the 17th is positive when asked.

## 2018-12-24 DIAGNOSIS — J9601 Acute respiratory failure with hypoxia: Secondary | ICD-10-CM

## 2018-12-24 DIAGNOSIS — U071 COVID-19: Secondary | ICD-10-CM

## 2018-12-24 DIAGNOSIS — J1282 Pneumonia due to coronavirus disease 2019: Secondary | ICD-10-CM

## 2018-12-24 DIAGNOSIS — J1289 Other viral pneumonia: Secondary | ICD-10-CM

## 2018-12-24 LAB — CBC WITH DIFFERENTIAL/PLATELET
Abs Immature Granulocytes: 0.01 10*3/uL (ref 0.00–0.07)
Basophils Absolute: 0 10*3/uL (ref 0.0–0.1)
Basophils Relative: 1 %
Eosinophils Absolute: 0 10*3/uL (ref 0.0–0.5)
Eosinophils Relative: 0 %
HCT: 44.1 % (ref 39.0–52.0)
Hemoglobin: 14.8 g/dL (ref 13.0–17.0)
Immature Granulocytes: 1 %
Lymphocytes Relative: 32 %
Lymphs Abs: 0.6 10*3/uL — ABNORMAL LOW (ref 0.7–4.0)
MCH: 30.4 pg (ref 26.0–34.0)
MCHC: 33.6 g/dL (ref 30.0–36.0)
MCV: 90.6 fL (ref 80.0–100.0)
Monocytes Absolute: 0.1 10*3/uL (ref 0.1–1.0)
Monocytes Relative: 6 %
Neutro Abs: 1.2 10*3/uL — ABNORMAL LOW (ref 1.7–7.7)
Neutrophils Relative %: 60 %
Platelets: 183 10*3/uL (ref 150–400)
RBC: 4.87 MIL/uL (ref 4.22–5.81)
RDW: 12.3 % (ref 11.5–15.5)
WBC: 1.9 10*3/uL — ABNORMAL LOW (ref 4.0–10.5)
nRBC: 0 % (ref 0.0–0.2)

## 2018-12-24 LAB — COMPREHENSIVE METABOLIC PANEL
ALT: 15 U/L (ref 0–44)
AST: 33 U/L (ref 15–41)
Albumin: 3.4 g/dL — ABNORMAL LOW (ref 3.5–5.0)
Alkaline Phosphatase: 58 U/L (ref 38–126)
Anion gap: 12 (ref 5–15)
BUN: 21 mg/dL — ABNORMAL HIGH (ref 6–20)
CO2: 19 mmol/L — ABNORMAL LOW (ref 22–32)
Calcium: 8.3 mg/dL — ABNORMAL LOW (ref 8.9–10.3)
Chloride: 101 mmol/L (ref 98–111)
Creatinine, Ser: 0.84 mg/dL (ref 0.61–1.24)
GFR calc Af Amer: >60 mL/min (ref >60)
GFR calc non Af Amer: >60 mL/min (ref >60)
Glucose, Bld: 279 mg/dL — ABNORMAL HIGH (ref 70–99)
Potassium: 4 mmol/L (ref 3.5–5.1)
Sodium: 132 mmol/L — ABNORMAL LOW (ref 135–145)
Total Bilirubin: 1.2 mg/dL (ref 0.3–1.2)
Total Protein: 7.1 g/dL (ref 6.5–8.1)

## 2018-12-24 LAB — LACTIC ACID, PLASMA: Lactic Acid, Venous: 1.5 mmol/L (ref 0.5–1.9)

## 2018-12-24 LAB — GLUCOSE, CAPILLARY
Glucose-Capillary: 236 mg/dL — ABNORMAL HIGH (ref 70–99)
Glucose-Capillary: 286 mg/dL — ABNORMAL HIGH (ref 70–99)
Glucose-Capillary: 232 mg/dL — ABNORMAL HIGH (ref 70–99)
Glucose-Capillary: 255 mg/dL — ABNORMAL HIGH (ref 70–99)

## 2018-12-24 LAB — MAGNESIUM: Magnesium: 2.3 mg/dL (ref 1.7–2.4)

## 2018-12-24 MED ORDER — VITAMIN C 500 MG PO TABS
500.0000 mg | ORAL_TABLET | Freq: Every day | ORAL | Status: DC
  Administered 2018-12-24 – 2018-12-28 (×5): 500 mg via ORAL
  Filled 2018-12-24 (×5): qty 1

## 2018-12-24 MED ORDER — INSULIN GLARGINE 100 UNIT/ML ~~LOC~~ SOLN
8.0000 [IU] | Freq: Two times a day (BID) | SUBCUTANEOUS | Status: DC
  Administered 2018-12-24 – 2018-12-25 (×3): 8 [IU] via SUBCUTANEOUS
  Filled 2018-12-24 (×4): qty 0.08

## 2018-12-24 MED ORDER — ZINC SULFATE 220 (50 ZN) MG PO CAPS
220.0000 mg | ORAL_CAPSULE | Freq: Every day | ORAL | Status: DC
  Administered 2018-12-24 – 2018-12-28 (×5): 220 mg via ORAL
  Filled 2018-12-24 (×5): qty 1

## 2018-12-24 MED ORDER — SODIUM CHLORIDE 0.9 % IV SOLN
100.0000 mg | INTRAVENOUS | Status: AC
  Administered 2018-12-24 – 2018-12-27 (×4): 100 mg via INTRAVENOUS
  Filled 2018-12-24 (×4): qty 20

## 2018-12-24 MED ORDER — DIPHENHYDRAMINE HCL 25 MG PO CAPS
25.0000 mg | ORAL_CAPSULE | Freq: Every evening | ORAL | Status: DC | PRN
  Administered 2018-12-24 – 2018-12-25 (×2): 25 mg via ORAL
  Filled 2018-12-24 (×2): qty 1

## 2018-12-24 NOTE — Progress Notes (Signed)
Pt was tx to [facility] today for COVID management. He is on O2 support and positive CXR. He got the loading dose of remdesivir at [facility].  ALT wnl Scr <1  Remdesivir 100mg  IV q24 x4  [name], PharmD, [place], AAHIVP, CPP Infectious Disease Pharmacist 12/24/2018 3:22 PM

## 2018-12-24 NOTE — Progress Notes (Signed)
Inpatient Diabetes Program Recommendations  AACE/ADA: New Consensus Statement on Inpatient Glycemic Control (2015)  Target Ranges:  Prepandial:   less than 140 mg/dL      Peak postprandial:   less than 180 mg/dL (1-2 hours)      Critically ill patients:  140 - 180 mg/dL   Lab Results  Component Value Date   GLUCAP 286 (H) 12/24/2018   HGBA1C 8.7 (H) 12/23/2018    Review of Glycemic Control Results for Jacob Durham, Jacob Durham (MRN [id number]) as of 12/24/2018 14:17  Ref. Range 12/24/2018 08:52 12/24/2018 12:23  Glucose-Capillary Latest Ref Range: 70 - 99 mg/dL 236 (H) 286 (H)   Diabetes history: DM 2 Outpatient Diabetes medications:  Metformin- patient unsure of dose Current orders for Inpatient glycemic control:  Novolog moderate tid with meals and HS  Inpatient Diabetes Program Recommendations:    Please consider adding Levemir 8 units bid.   Thanks Adah Perl, RN, BC-ADM Inpatient Diabetes Coordinator Pager [phone] (8a-5p)

## 2018-12-24 NOTE — Progress Notes (Signed)
Spoke with Dr Cathlean Sauer regarding pts request to shower.  MD agreed that due to pts desaturation while ambulating that patient is unable to shower at this time.

## 2018-12-24 NOTE — Progress Notes (Signed)
PROGRESS NOTE    Jacob Durham  NIO:[id number] DOB: [birth year] DOA: 12/23/2018 PCP: System, Pcp Not In    Brief Narrative:  58 year old male who presented with dyspnea and body aches, he does have significant past medical history for type 2 diabetes mellitus and dyslipidemia.  He tested positive for Jacob Durham October 17, he reports persistent dyspnea, cough, and body aches.  On his initial physical examination blood pressure 144/90, heart rate 83-92, respiratory rate 21-31, oxygen saturation 91%.  His lungs had rhonchi bilaterally, heart S1-S2 present and rhythmic, abdomen was soft, no lower extremity edema. Sodium 135, potassium 3.7, chloride 99, bicarb 24, glucose 115, BUN 12, creatinine 1.0, 0.5, hemoglobin 14.9, hematocrit 42.8, platelets 148, Jacob Durham positive 10/17.  His chest x-ray had bilateral interstitial infiltrates, bilateral bases and right upper lobe, predominantly left lower lobe.   Patient was admitted to the hospital with a working diagnosis of Jacob Durham viral pneumonia complicated by acute hypoxic respiratory failure   Assessment & Plan:   Principal Problem:   Acute respiratory failure with hypoxia (HCC) Active Problems:   Type II diabetes mellitus with manifestations (HCC)   Durham virus infection   Pneumonia due to Durham virus  1. Acute hypoxic respiratory failure due to Jacob COVID 19 viral pneumonia. Patient continue to have dyspnea, symptoms have improved not yet back to baseline.   RR: 18 to 20  Fi02: 28 % with 2 LPM per [place]  Pulse oxymetry: 94 to 96%   Durham Labs  Recent Labs    12/23/18 1620  FERRITIN 714*  LDH 578*  CRP 10.2*   Continue medical therapy with Remdesivir #2/5 (AST 33 - ALT 15) . Continue with systemic steroids with dexamethasone 6 mg Iv daily. Bronchodilator therapy with albuterol. Continue guainfenesin, will add vitamin c and zinc. DC IV fluids, follow with a conservative IV fluids strategy.   2. T2DM with steroid  induced hyperglycemia. Fasting glucose is 279, capillary 286, will continue glucose cover and monitoring with insulin sliding scale, this am has utilized 13 units of insulin aspart. Will add 8 units of glargine bid starting tonight and will continue further adjustments. Patient is tolerating po well.   3. Leukopenia. WBC down to 1,9 today, will continue close monitoring. Hgb [place] 14,8 and Platelets are 183.   4.  Insomnia. Will add as needed diphenhydramine.   5. Obesity. Calculated BMI is 29,4.   DVT prophylaxis: enoxaparin   Code Status:  full Family Communication: no family at the bedside  Disposition Plan/ discharge barriers: pending clinical improvement.  Body mass index is 29.48 kg/m. Malnutrition Type:      Malnutrition Characteristics:      Nutrition Interventions:     RN Pressure Injury Documentation:     Consultants:    Procedures:     Antimicrobials:       Subjective: Patient with improved symptoms, but continue to have dyspnea, not yet back to baseline, positive nausea but no vomiting, no chest pain.   Objective: Vitals:   12/23/18 1845 12/23/18 2003 12/23/18 2208 12/24/18 0425  BP: 107/69 106/64 106/64 120/85  Pulse:  72 72 71  Resp: 19 19 19 20   Temp:  98.2 F (36.8 C) 98.2 F (36.8 C) 98.2 F (36.8 C)  TempSrc:   Oral   SpO2:  98%  94%  Weight:   84.1 kg   Height:   5' 6.5" (1.689 m)     Intake/Output Summary (Last 24 hours) at 12/24/2018 12/26/2018 Last data  filed at 12/24/2018 0434 Gross per 24 hour  Intake 763.75 ml  Output -  Net 763.75 ml   Filed Weights   12/23/18 2208  Weight: 84.1 kg    Examination:   General: Not in pain or dyspnea, deconditioned  Neurology: Awake and alert, non focal  E ENT: mild pallor, no icterus, oral mucosa moist Cardiovascular: No JVD. S1-S2 present, rhythmic, no gallops, rubs, or murmurs. No lower extremity edema. Pulmonary: positive breath sounds bilaterally. Gastrointestinal. Abdomen with no  organomegaly, non tender, no rebound or guarding Skin. No rashes Musculoskeletal: no joint deformities     Data Reviewed: I have personally reviewed following labs and imaging studies  CBC: Recent Labs  Lab 12/20/18 0503 12/23/18 1454  WBC 3.7* 3.5*  NEUTROABS 2.3 2.9  HGB 14.6 14.9  HCT 42.3 42.8  MCV 93.0 90.3  PLT 120* 202*   Basic Metabolic Panel: Recent Labs  Lab 12/20/18 0503 12/23/18 1454  NA 135 135  K 3.9 3.7  CL 102 99  CO2 23 24  GLUCOSE 124* 115*  BUN 12 12  CREATININE 1.16 1.00  CALCIUM 8.5* 8.6*   GFR: Estimated Creatinine Clearance: 82.7 mL/min (by C-G formula based on SCr of 1 mg/dL). Liver Function Tests: Recent Labs  Lab 12/20/18 0503 12/23/18 1454  AST 26 49*  ALT 18 16  ALKPHOS 62 58  BILITOT 1.0 1.3*  PROT 7.0 7.1  ALBUMIN 3.8 3.5   Recent Labs  Lab 12/23/18 1505  LIPASE 22   No results for input(s): AMMONIA in the last 168 hours. Coagulation Profile: No results for input(s): INR, PROTIME in the last 168 hours. Cardiac Enzymes: No results for input(s): CKTOTAL, CKMB, CKMBINDEX, TROPONINI in the last 168 hours. BNP (last 3 results) No results for input(s): PROBNP in the last 8760 hours. HbA1C: Recent Labs    12/23/18 1735  HGBA1C 8.7*   CBG: Recent Labs  Lab 12/20/18 0504 12/23/18 2046  GLUCAP 104* 174*   Lipid Profile: Recent Labs    12/23/18 1620  TRIG 92   Thyroid Function Tests: No results for input(s): TSH, T4TOTAL, FREET4, T3FREE, THYROIDAB in the last 72 hours. Anemia Panel: Recent Labs    12/23/18 1620  FERRITIN 714*      Radiology Studies: I have reviewed all of the imaging during this hospital visit personally     Scheduled Meds: . albuterol  2 puff Inhalation Q6H  . dexamethasone  6 mg Oral Q24H  . enoxaparin (LOVENOX) injection  40 mg Subcutaneous Q24H  . insulin aspart  0-15 Units Subcutaneous TID WC  . insulin aspart  0-5 Units Subcutaneous QHS   Continuous Infusions: . sodium  chloride 75 mL/hr at 12/24/18 0434     LOS: 1 day        [name] Gerome Apley, MD

## 2018-12-25 LAB — COMPREHENSIVE METABOLIC PANEL
ALT: 15 U/L (ref 0–44)
AST: 28 U/L (ref 15–41)
Albumin: 3.2 g/dL — ABNORMAL LOW (ref 3.5–5.0)
Alkaline Phosphatase: 56 U/L (ref 38–126)
Anion gap: 9 (ref 5–15)
BUN: 19 mg/dL (ref 6–20)
CO2: 23 mmol/L (ref 22–32)
Calcium: 8.7 mg/dL — ABNORMAL LOW (ref 8.9–10.3)
Chloride: 103 mmol/L (ref 98–111)
Creatinine, Ser: 0.82 mg/dL (ref 0.61–1.24)
GFR calc Af Amer: >60 mL/min (ref >60)
GFR calc non Af Amer: >60 mL/min (ref >60)
Glucose, Bld: 259 mg/dL — ABNORMAL HIGH (ref 70–99)
Potassium: 4.2 mmol/L (ref 3.5–5.1)
Sodium: 135 mmol/L (ref 135–145)
Total Bilirubin: 0.6 mg/dL (ref 0.3–1.2)
Total Protein: 6.6 g/dL (ref 6.5–8.1)

## 2018-12-25 LAB — D-DIMER, QUANTITATIVE: D-Dimer, Quant: 0.84 ug/mL-FEU — ABNORMAL HIGH (ref 0.00–0.50)

## 2018-12-25 LAB — CBC WITH DIFFERENTIAL/PLATELET
Abs Immature Granulocytes: 0.02 10*3/uL (ref 0.00–0.07)
Basophils Absolute: 0 10*3/uL (ref 0.0–0.1)
Basophils Relative: 0 %
Eosinophils Absolute: 0 10*3/uL (ref 0.0–0.5)
Eosinophils Relative: 0 %
HCT: 38.8 % — ABNORMAL LOW (ref 39.0–52.0)
Hemoglobin: 13.1 g/dL (ref 13.0–17.0)
Immature Granulocytes: 0 %
Lymphocytes Relative: 14 %
Lymphs Abs: 0.8 10*3/uL (ref 0.7–4.0)
MCH: 30.7 pg (ref 26.0–34.0)
MCHC: 33.8 g/dL (ref 30.0–36.0)
MCV: 90.9 fL (ref 80.0–100.0)
Monocytes Absolute: 0.3 10*3/uL (ref 0.1–1.0)
Monocytes Relative: 5 %
Neutro Abs: 4.4 10*3/uL (ref 1.7–7.7)
Neutrophils Relative %: 81 %
Platelets: 212 10*3/uL (ref 150–400)
RBC: 4.27 MIL/uL (ref 4.22–5.81)
RDW: 12.4 % (ref 11.5–15.5)
WBC: 5.5 10*3/uL (ref 4.0–10.5)
nRBC: 0 % (ref 0.0–0.2)

## 2018-12-25 LAB — GLUCOSE, CAPILLARY
Glucose-Capillary: 206 mg/dL — ABNORMAL HIGH (ref 70–99)
Glucose-Capillary: 246 mg/dL — ABNORMAL HIGH (ref 70–99)
Glucose-Capillary: 188 mg/dL — ABNORMAL HIGH (ref 70–99)
Glucose-Capillary: 245 mg/dL — ABNORMAL HIGH (ref 70–99)

## 2018-12-25 LAB — C-REACTIVE PROTEIN: CRP: 5 mg/dL — ABNORMAL HIGH (ref <1.0)

## 2018-12-25 LAB — FERRITIN: Ferritin: 609 ng/mL — ABNORMAL HIGH (ref 24–336)

## 2018-12-25 NOTE — Progress Notes (Signed)
PROGRESS NOTE    Jacob Durham  TGG:[id number] DOB: 10/26/1960 DOA: 12/23/2018 PCP: System, Pcp Not In    Brief Narrative:  58 year old male who presented with dyspnea and body aches, he does have significant past medical history for type 2 diabetes mellitus and dyslipidemia.  He tested positive for SARS COVID-19 October 17, he reports persistent dyspnea, cough, and body aches.  On his initial physical examination blood pressure 144/90, heart rate 83-92, respiratory rate 21-31, oxygen saturation 91%.  His lungs had rhonchi bilaterally, heart S1-S2 present and rhythmic, abdomen was soft, no lower extremity edema. Sodium 135, potassium 3.7, chloride 99, bicarb 24, glucose 115, BUN 12, creatinine 1.0, 0.5, hemoglobin 14.9, hematocrit 42.8, platelets 148, SARS COVID-19 positive 10/17.  His chest x-ray had bilateral interstitial infiltrates, bilateral bases and right upper lobe, predominantly left lower lobe.   Patient was admitted to the hospital with a working diagnosis of SARS COVID-19 viral pneumonia complicated by acute hypoxic respiratory failure.  Patient has been responding well to the medical therapy with improving symptoms and oxygen requirements.    Assessment & Plan:   Principal Problem:   Acute respiratory failure with hypoxia (HCC) Active Problems:   Type II diabetes mellitus with manifestations (HCC)   COVID-19 virus infection   Pneumonia due to COVID-19 virus  1. Acute hypoxic respiratory failure due to SARS COVID 19 viral pneumonia. Improving symptoms but not yet close to baseline.  RR: 20 to 22 Fi02: 28 % with 2 LPM per [place]  Pulse oxymetry: 94 %  COVID-19 Labs  Recent Labs    12/23/18 1620 12/25/18 0615  DDIMER  --  0.84*  FERRITIN 714* 609*  LDH 578*  --   CRP 10.2* 5.0*     Continue medical therapy with Remdesivir #3/5 (AST 28 - ALT 15) . Systemic corticosteroids with dexamethasone 6 mg Iv daily. Bronchodilator therapy with albuterol.On guainfenesin,  vitamin c and zinc.   2. T2DM with steroid induced hyperglycemia. Today his fasting glucose is 259. Continue with 8 units of glargine bid and insulin sliding scale, will continue to calculate insulin requirements.   3. Leukopenia. Cell count up to 5,5 (WBC), with stable hgb and plt.   4.  Insomnia. Continue with as needed diphenhydramine.   5. Obesity. Calculated BMI is 29,4. Will need outpatient follow up.    DVT prophylaxis: enoxaparin   Code Status: full Family Communication: no family at the bedside  Disposition Plan/ discharge barriers: pending clinical improvement.   Body mass index is 29.48 kg/m. Malnutrition Type:      Malnutrition Characteristics:      Nutrition Interventions:     RN Pressure Injury Documentation:     Consultants:     Procedures:     Antimicrobials:       Subjective: Patient continue to improve dyspnea, but not yet back to baseline, no nausea or vomiting, tolerating po well.   Objective: Vitals:   12/24/18 1655 12/24/18 1726 12/24/18 1914 12/25/18 0434  BP:   (!) 122/95 108/74  Pulse: 72  89 66  Resp:   20 20  Temp:   97.9 F (36.6 C) 98.2 F (36.8 C)  TempSrc:      SpO2: 93% 95% 94% 94%  Weight:      Height:        Intake/Output Summary (Last 24 hours) at 12/25/2018 0812 Last data filed at 12/24/2018 1800 Gross per 24 hour  Intake 1977.5 ml  Output -  Net 1977.5 ml  Filed Weights   12/23/18 2208  Weight: 84.1 kg    Examination:   General: Not in pain or dyspnea, deconditioned  Neurology: Awake and alert, non focal  E ENT: no pallor, no icterus, oral mucosa moist Cardiovascular: No JVD. S1-S2 present, rhythmic, no gallops, rubs, or murmurs. No lower extremity edema. Pulmonary: positive breath sounds bilaterally. Gastrointestinal. Abdomen with  no organomegaly, non tender, no rebound or guarding Skin. No rashes Musculoskeletal: no joint deformities     Data Reviewed: I have personally reviewed  following labs and imaging studies  CBC: Recent Labs  Lab 12/20/18 0503 12/23/18 1454 12/24/18 0730 12/25/18 0615  WBC 3.7* 3.5* 1.9* 5.5  NEUTROABS 2.3 2.9 1.2* PENDING  HGB 14.6 14.9 14.8 13.1  HCT 42.3 42.8 44.1 38.8*  MCV 93.0 90.3 90.6 90.9  PLT 120* 148* 183 735   Basic Metabolic Panel: Recent Labs  Lab 12/20/18 0503 12/23/18 1454 12/24/18 0730 12/25/18 0615  NA 135 135 132* 135  K 3.9 3.7 4.0 4.2  CL 102 99 101 103  CO2 23 24 19* 23  GLUCOSE 124* 115* 279* 259*  BUN 12 12 21* 19  CREATININE 1.16 1.00 0.84 0.82  CALCIUM 8.5* 8.6* 8.3* 8.7*  MG  --   --  2.3  --    GFR: Estimated Creatinine Clearance: 100.8 mL/min (by C-G formula based on SCr of 0.82 mg/dL). Liver Function Tests: Recent Labs  Lab 12/20/18 0503 12/23/18 1454 12/24/18 0730 12/25/18 0615  AST 26 49* 33 28  ALT 18 16 15 15   ALKPHOS 62 58 58 56  BILITOT 1.0 1.3* 1.2 0.6  PROT 7.0 7.1 7.1 6.6  ALBUMIN 3.8 3.5 3.4* 3.2*   Recent Labs  Lab 12/23/18 1505  LIPASE 22   No results for input(s): AMMONIA in the last 168 hours. Coagulation Profile: No results for input(s): INR, PROTIME in the last 168 hours. Cardiac Enzymes: No results for input(s): CKTOTAL, CKMB, CKMBINDEX, TROPONINI in the last 168 hours. BNP (last 3 results) No results for input(s): PROBNP in the last 8760 hours. HbA1C: Recent Labs    12/23/18 1735  HGBA1C 8.7*   CBG: Recent Labs  Lab 12/23/18 2046 12/24/18 0852 12/24/18 1223 12/24/18 1739 12/24/18 2003  GLUCAP 174* 236* 286* 232* 255*   Lipid Profile: Recent Labs    12/23/18 1620  TRIG 92   Thyroid Function Tests: No results for input(s): TSH, T4TOTAL, FREET4, T3FREE, THYROIDAB in the last 72 hours. Anemia Panel: Recent Labs    12/23/18 1620 12/25/18 0615  FERRITIN 714* 609*      Radiology Studies: I have reviewed all of the imaging during this hospital visit personally     Scheduled Meds: . albuterol  2 puff Inhalation Q6H  .  dexamethasone  6 mg Oral Q24H  . enoxaparin (LOVENOX) injection  40 mg Subcutaneous Q24H  . insulin aspart  0-15 Units Subcutaneous TID WC  . insulin aspart  0-5 Units Subcutaneous QHS  . insulin glargine  8 Units Subcutaneous BID  . vitamin C  500 mg Oral Daily  . zinc sulfate  220 mg Oral Daily   Continuous Infusions: . remdesivir 100 mg in NS 250 mL Stopped (12/24/18 1710)     LOS: 2 days        Elani Delph Gerome Apley, MD

## 2018-12-26 LAB — GLUCOSE, CAPILLARY
Glucose-Capillary: 238 mg/dL — ABNORMAL HIGH (ref 70–99)
Glucose-Capillary: 242 mg/dL — ABNORMAL HIGH (ref 70–99)
Glucose-Capillary: 55 mg/dL — ABNORMAL LOW (ref 70–99)
Glucose-Capillary: 75 mg/dL (ref 70–99)
Glucose-Capillary: 101 mg/dL — ABNORMAL HIGH (ref 70–99)

## 2018-12-26 LAB — CBC WITH DIFFERENTIAL/PLATELET
Abs Immature Granulocytes: 0.04 10*3/uL (ref 0.00–0.07)
Basophils Absolute: 0 10*3/uL (ref 0.0–0.1)
Basophils Relative: 0 %
Eosinophils Absolute: 0 10*3/uL (ref 0.0–0.5)
Eosinophils Relative: 0 %
HCT: 39.8 % (ref 39.0–52.0)
Hemoglobin: 13.4 g/dL (ref 13.0–17.0)
Immature Granulocytes: 1 %
Lymphocytes Relative: 9 %
Lymphs Abs: 0.4 10*3/uL — ABNORMAL LOW (ref 0.7–4.0)
MCH: 30.5 pg (ref 26.0–34.0)
MCHC: 33.7 g/dL (ref 30.0–36.0)
MCV: 90.7 fL (ref 80.0–100.0)
Monocytes Absolute: 0.2 10*3/uL (ref 0.1–1.0)
Monocytes Relative: 4 %
Neutro Abs: 4.3 10*3/uL (ref 1.7–7.7)
Neutrophils Relative %: 86 %
Platelets: 241 10*3/uL (ref 150–400)
RBC: 4.39 MIL/uL (ref 4.22–5.81)
RDW: 12.2 % (ref 11.5–15.5)
WBC: 5 10*3/uL (ref 4.0–10.5)
nRBC: 0 % (ref 0.0–0.2)

## 2018-12-26 LAB — COMPREHENSIVE METABOLIC PANEL
ALT: 20 U/L (ref 0–44)
AST: 32 U/L (ref 15–41)
Albumin: 3.4 g/dL — ABNORMAL LOW (ref 3.5–5.0)
Alkaline Phosphatase: 60 U/L (ref 38–126)
Anion gap: 11 (ref 5–15)
BUN: 18 mg/dL (ref 6–20)
CO2: 23 mmol/L (ref 22–32)
Calcium: 8.6 mg/dL — ABNORMAL LOW (ref 8.9–10.3)
Chloride: 101 mmol/L (ref 98–111)
Creatinine, Ser: 0.87 mg/dL (ref 0.61–1.24)
GFR calc Af Amer: >60 mL/min (ref >60)
GFR calc non Af Amer: >60 mL/min (ref >60)
Glucose, Bld: 284 mg/dL — ABNORMAL HIGH (ref 70–99)
Potassium: 4 mmol/L (ref 3.5–5.1)
Sodium: 135 mmol/L (ref 135–145)
Total Bilirubin: 0.6 mg/dL (ref 0.3–1.2)
Total Protein: 6.7 g/dL (ref 6.5–8.1)

## 2018-12-26 LAB — C-REACTIVE PROTEIN: CRP: 2.6 mg/dL — ABNORMAL HIGH (ref <1.0)

## 2018-12-26 LAB — FERRITIN: Ferritin: 532 ng/mL — ABNORMAL HIGH (ref 24–336)

## 2018-12-26 LAB — D-DIMER, QUANTITATIVE: D-Dimer, Quant: 0.68 ug/mL-FEU — ABNORMAL HIGH (ref 0.00–0.50)

## 2018-12-26 MED ORDER — INSULIN ASPART 100 UNIT/ML ~~LOC~~ SOLN
3.0000 [IU] | Freq: Three times a day (TID) | SUBCUTANEOUS | Status: DC
  Administered 2018-12-26 – 2018-12-28 (×4): 3 [IU] via SUBCUTANEOUS

## 2018-12-26 MED ORDER — INSULIN GLARGINE 100 UNIT/ML ~~LOC~~ SOLN
10.0000 [IU] | Freq: Two times a day (BID) | SUBCUTANEOUS | Status: DC
  Administered 2018-12-26: 10 [IU] via SUBCUTANEOUS
  Filled 2018-12-26 (×2): qty 0.1

## 2018-12-26 MED ORDER — INSULIN GLARGINE 100 UNIT/ML ~~LOC~~ SOLN
10.0000 [IU] | Freq: Every day | SUBCUTANEOUS | Status: DC
  Administered 2018-12-27 – 2018-12-28 (×2): 10 [IU] via SUBCUTANEOUS
  Filled 2018-12-26 (×2): qty 0.1

## 2018-12-26 NOTE — Progress Notes (Signed)
PROGRESS NOTE    Jacob Durham  KZS:[id number] DOB: 08-25-1960 DOA: 12/23/2018 PCP: System, Pcp Not In    Brief Narrative:  58 year old male who presented with dyspnea and body aches, he does have significant past medical history for type 2 diabetes mellitus and dyslipidemia. He tested positive for SARS COVID-19 October 17, he reports persistent dyspnea, cough, andbody aches.On his initial physical examination blood pressure 144/90, heart rate 83-92, respiratory rate 21-31, oxygen saturation 91%.His lungs had rhonchi bilaterally, heart S1-S2 present and rhythmic, abdomen was soft, no lower extremity edema. Sodium 135, potassium 3.7, chloride 99, bicarb 24, glucose 115, BUN 12, creatinine 1.0, 0.5, hemoglobin 14.9, hematocrit 42.8, platelets 148,SARS COVID-19 positive10/17.His chest x-ray had bilateral interstitial infiltrates, bilateralbases and right upper lobe, predominantly left lower lobe.  Patient was admitted to the hospital with a working diagnosis of SARS COVID-19 viral pneumonia complicated by acute hypoxic respiratory failure.  Patient has been responding well to the medical therapy with improving symptoms and oxygen requirements.    Assessment & Plan:   Principal Problem:   Acute respiratory failure with hypoxia (HCC) Active Problems:   Type II diabetes mellitus with manifestations ([place])   COVID-19 virus infection   Pneumonia due to COVID-19 virus   1. Acute hypoxic respiratory failure due to SARS COVID 19 viral pneumonia. Continue with mproving symptoms.   RR: 18  Fi02: 21% on room air Pulse oxymetry:96 %  COVID-19 Labs  Recent Labs    12/23/18 1620 12/25/18 0615 12/26/18 0209  DDIMER  --  0.84* 0.68*  FERRITIN 714* 609* 532*  LDH 578*  --   --   CRP 10.2* 5.0* 2.6*    Lab Results  Component Value Date   SARSCOV2NAA POSITIVE (A) 12/20/2018   Tolerating well medical therapy with Remdesivir #4/5 (AST 23 - ALT 20) . Continue with  corticosteroids with dexamethasone 6 mg Iv daily. On albuterol, guainfenesin, vitamin c and zinc. If continue to improve will plan for discharge in am.   2. T2DM with steroid induced hyperglycemia.Persistent fasting hyperglycemia, today up to 284. Will increase basal insulin to 10  units of glargine bid, continue meal coverage with 3 units and insulin sliding scale, patient is tolerating po well.   3. Leukopenia. Stable wbc at to 5,0.  4. Insomnia. On as needed diphenhydramine.   5. Obesity. BMI is 29,4.   DVT prophylaxis: enoxaparin   Code Status: full Family Communication: no family at the bedside  Disposition Plan/ discharge barriers: pending clinical improvement  Body mass index is 29.48 kg/m. Malnutrition Type:      Malnutrition Characteristics:      Nutrition Interventions:     RN Pressure Injury Documentation:     Consultants:     Procedures:     Antimicrobials:       Subjective: Patient feeling better, dyspnea continue to improve but not yet back to baseline, no nausea or vomiting, tolerating po well.   Objective: Vitals:   12/25/18 2315 12/26/18 0416 12/26/18 0747 12/26/18 0814  BP:  (!) 125/91 (!) 132/103   Pulse:  67 74   Resp: 18 18 18    Temp: 97.9 F (36.6 C) 98.4 F (36.9 C) 98.4 F (36.9 C)   TempSrc: Oral Oral Oral   SpO2:  96% 96% 91%  Weight:      Height:        Intake/Output Summary (Last 24 hours) at 12/26/2018 0901 Last data filed at 12/25/2018 2007 Gross per 24 hour  Intake 360 ml  Output -  Net 360 ml   Filed Weights   12/23/18 2208  Weight: 84.1 kg    Examination:   General: Not in pain or dyspnea, deconditioned  Neurology: Awake and alert, non focal  E ENT: no pallor, no icterus, oral mucosa moist Cardiovascular: No JVD. S1-S2 present, rhythmic, no gallops, rubs, or murmurs. No lower extremity edema. Pulmonary: positive breath sounds bilaterally. Gastrointestinal. Abdomen with no organomegaly, non  tender, no rebound or guarding Skin. No rashes Musculoskeletal: no joint deformities     Data Reviewed: I have personally reviewed following labs and imaging studies  CBC: Recent Labs  Lab 12/20/18 0503 12/23/18 1454 12/24/18 0730 12/25/18 0615 12/26/18 0209  WBC 3.7* 3.5* 1.9* 5.5 5.0  NEUTROABS 2.3 2.9 1.2* 4.4 4.3  HGB 14.6 14.9 14.8 13.1 13.4  HCT 42.3 42.8 44.1 38.8* 39.8  MCV 93.0 90.3 90.6 90.9 90.7  PLT 120* 148* 183 212 241   Basic Metabolic Panel: Recent Labs  Lab 12/20/18 0503 12/23/18 1454 12/24/18 0730 12/25/18 0615 12/26/18 0209  NA 135 135 132* 135 135  K 3.9 3.7 4.0 4.2 4.0  CL 102 99 101 103 101  CO2 23 24 19* 23 23  GLUCOSE 124* 115* 279* 259* 284*  BUN 12 12 21* 19 18  CREATININE 1.16 1.00 0.84 0.82 0.87  CALCIUM 8.5* 8.6* 8.3* 8.7* 8.6*  MG  --   --  2.3  --   --    GFR: Estimated Creatinine Clearance: 95 mL/min (by C-G formula based on SCr of 0.87 mg/dL). Liver Function Tests: Recent Labs  Lab 12/20/18 0503 12/23/18 1454 12/24/18 0730 12/25/18 0615 12/26/18 0209  AST 26 49* 33 28 32  ALT 18 16 15 15 20   ALKPHOS 62 58 58 56 60  BILITOT 1.0 1.3* 1.2 0.6 0.6  PROT 7.0 7.1 7.1 6.6 6.7  ALBUMIN 3.8 3.5 3.4* 3.2* 3.4*   Recent Labs  Lab 12/23/18 1505  LIPASE 22   No results for input(s): AMMONIA in the last 168 hours. Coagulation Profile: No results for input(s): INR, PROTIME in the last 168 hours. Cardiac Enzymes: No results for input(s): CKTOTAL, CKMB, CKMBINDEX, TROPONINI in the last 168 hours. BNP (last 3 results) No results for input(s): PROBNP in the last 8760 hours. HbA1C: Recent Labs    12/23/18 1735  HGBA1C 8.7*   CBG: Recent Labs  Lab 12/25/18 0819 12/25/18 1159 12/25/18 1613 12/25/18 2045 12/26/18 0756  GLUCAP 206* 246* 188* 245* 238*   Lipid Profile: Recent Labs    12/23/18 1620  TRIG 92   Thyroid Function Tests: No results for input(s): TSH, T4TOTAL, FREET4, T3FREE, THYROIDAB in the last 72  hours. Anemia Panel: Recent Labs    12/25/18 0615 12/26/18 0209  FERRITIN 609* 532*      Radiology Studies: I have reviewed all of the imaging during this hospital visit personally     Scheduled Meds: . albuterol  2 puff Inhalation Q6H  . dexamethasone  6 mg Oral Q24H  . enoxaparin (LOVENOX) injection  40 mg Subcutaneous Q24H  . insulin aspart  0-15 Units Subcutaneous TID WC  . insulin aspart  0-5 Units Subcutaneous QHS  . insulin glargine  8 Units Subcutaneous BID  . vitamin C  500 mg Oral Daily  . zinc sulfate  220 mg Oral Daily   Continuous Infusions: . remdesivir 100 mg in NS 250 mL 100 mg (12/25/18 1620)     LOS: 3 days  Mauricio Gerome Apley, MD

## 2018-12-27 LAB — COMPREHENSIVE METABOLIC PANEL
ALT: 59 U/L — ABNORMAL HIGH (ref 0–44)
AST: 91 U/L — ABNORMAL HIGH (ref 15–41)
Albumin: 3.3 g/dL — ABNORMAL LOW (ref 3.5–5.0)
Alkaline Phosphatase: 58 U/L (ref 38–126)
Anion gap: 10 (ref 5–15)
BUN: 14 mg/dL (ref 6–20)
CO2: 25 mmol/L (ref 22–32)
Calcium: 8.6 mg/dL — ABNORMAL LOW (ref 8.9–10.3)
Chloride: 102 mmol/L (ref 98–111)
Creatinine, Ser: 0.74 mg/dL (ref 0.61–1.24)
GFR calc Af Amer: >60 mL/min (ref >60)
GFR calc non Af Amer: >60 mL/min (ref >60)
Glucose, Bld: 185 mg/dL — ABNORMAL HIGH (ref 70–99)
Potassium: 4 mmol/L (ref 3.5–5.1)
Sodium: 137 mmol/L (ref 135–145)
Total Bilirubin: 0.8 mg/dL (ref 0.3–1.2)
Total Protein: 6.4 g/dL — ABNORMAL LOW (ref 6.5–8.1)

## 2018-12-27 LAB — GLUCOSE, CAPILLARY
Glucose-Capillary: 168 mg/dL — ABNORMAL HIGH (ref 70–99)
Glucose-Capillary: 173 mg/dL — ABNORMAL HIGH (ref 70–99)
Glucose-Capillary: 71 mg/dL (ref 70–99)
Glucose-Capillary: 57 mg/dL — ABNORMAL LOW (ref 70–99)

## 2018-12-27 LAB — D-DIMER, QUANTITATIVE: D-Dimer, Quant: 1.02 ug/mL-FEU — ABNORMAL HIGH (ref 0.00–0.50)

## 2018-12-27 LAB — FERRITIN: Ferritin: 512 ng/mL — ABNORMAL HIGH (ref 24–336)

## 2018-12-27 LAB — C-REACTIVE PROTEIN: CRP: 2.8 mg/dL — ABNORMAL HIGH (ref <1.0)

## 2018-12-27 MED ORDER — IPRATROPIUM-ALBUTEROL 20-100 MCG/ACT IN AERS
1.0000 | INHALATION_SPRAY | Freq: Four times a day (QID) | RESPIRATORY_TRACT | Status: DC
  Filled 2018-12-27: qty 4

## 2018-12-27 MED ORDER — ALBUTEROL SULFATE HFA 108 (90 BASE) MCG/ACT IN AERS
2.0000 | INHALATION_SPRAY | Freq: Four times a day (QID) | RESPIRATORY_TRACT | Status: DC | PRN
  Administered 2018-12-27: 2 via RESPIRATORY_TRACT
  Filled 2018-12-27: qty 6.7

## 2018-12-27 NOTE — Progress Notes (Signed)
PROGRESS NOTE    Jacob SlackDavid A Wessels  ZOX:[id number] DOB: 02-Mar-1961 DOA: 12/23/2018 PCP: System, Pcp Not In    Brief Narrative:  58 year old male who presented with dyspnea and body aches, he does have significant past medical history for type 2 diabetes mellitus and dyslipidemia. He tested positive for SARS COVID-19 October 17, he reports persistent dyspnea, cough, andbody aches.On his initial physical examination blood pressure 144/90, heart rate 83-92, respiratory rate 21-31, oxygen saturation 91%.His lungs had rhonchi bilaterally, heart S1-S2 present and rhythmic, abdomen was soft, no lower extremity edema. Sodium 135, potassium 3.7, chloride 99, bicarb 24, glucose 115, BUN 12, creatinine 1.0, 0.5, hemoglobin 14.9, hematocrit 42.8, platelets 148,SARS COVID-19 positive10/17.His chest x-ray had bilateral interstitial infiltrates, bilateralbases and right upper lobe, predominantly left lower lobe.  Patient was admitted to the hospital with a working diagnosis of SARS COVID-19 viral pneumonia complicated by acute hypoxic respiratory failure.  Patient has been responding well to the medical therapy with improving symptoms and oxygen requirements.  Continue to have significant cough and dyspnea on exertion.    Assessment & Plan:   Principal Problem:   Acute respiratory failure with hypoxia (HCC) Active Problems:   Type II diabetes mellitus with manifestations (HCC)   COVID-19 virus infection   Pneumonia due to COVID-19 virus   1. Acute hypoxic respiratory failure due to SARS COVID 19 viral pneumonia.Patient has improved but today has significant cough and dyspnea on exertion.   RR: 18 to19  Pulse oxymetry: 84 to 96% Fi02: 24%, 1 LPM per [place].   COVID-19 Labs  Recent Labs    12/25/18 0615 12/26/18 0209 12/27/18 0010  DDIMER 0.84* 0.68* 1.02*  FERRITIN 609* 532* 512*  CRP 5.0* 2.6* 2.8*    Inflammatory markers have been stable and continue to improve oxygen  requirements, but patient continue to have significant symptoms.   He will completes 5 doses of Remdesivir today, will continue with systemic  corticosteroids with dexamethasone 6 mg Iv daily. Bronchodilaotr therapy  (albuterol/ ipratropium), guaifenesin DM, chlorpheniramine, hydrocodone, vitamin c and zinc. Airway cleaning techniques with flutter valve and incentive spirometer. Out of bed tid with meals. Will check ambulatory oxymetry on room air today with possible plan to dc home in am with supplemental 02.   2. T2DM with steroid induced hyperglycemia.Yesterday episode of hypoglycemia down to 55, this am fasting glucose 185, basal insulin has been reduced to 10 mg daily, continue aspart insulin for meal coverage and insulin sliding scale.   3. Leukopenia.Cell count has been stable, no cbc today.   4. Insomnia.Continue with prn diphenhydramine.   5. Obesity. Calculated BMI is 29,4.   DVT prophylaxis:enoxaparin Code Status:full Family Communication:no family at the bedside Disposition Plan/ discharge barriers:pending clinical improvement   Body mass index is 29.48 kg/m. Malnutrition Type:      Malnutrition Characteristics:      Nutrition Interventions:     RN Pressure Injury Documentation:     Consultants:     Procedures:     Antimicrobials:       Subjective: Patient continue to have dyspnea, more severe with minimal exertion, like getting out of bed, associated with cough, but no nausea or vomiting.   Objective: Vitals:   12/27/18 0347 12/27/18 0500 12/27/18 0536 12/27/18 0800  BP: 121/84   122/79  Pulse: 71 82 72 66  Resp: 18   19  Temp: 98.7 F (37.1 C)   98.3 F (36.8 C)  TempSrc: Oral   Oral  SpO2: 96% (!) 84%  92% 96%  Weight:      Height:       No intake or output data in the 24 hours ending 12/27/18 0958 Filed Weights   12/23/18 2208  Weight: 84.1 kg    Examination:   General: Not in pain or dyspnea, deconditioned   Neurology: Awake and alert, non focal  E ENT: no pallor, no icterus, oral mucosa moist Cardiovascular: No JVD. S1-S2 present, rhythmic, no gallops, rubs, or murmurs. No lower extremity edema. Pulmonary: positive breath sounds bilaterally. Gastrointestinal. Abdomen  with no organomegaly, non tender, no rebound or guarding Skin. No rashes Musculoskeletal: no joint deformities     Data Reviewed: I have personally reviewed following labs and imaging studies  CBC: Recent Labs  Lab 12/23/18 1454 12/24/18 0730 12/25/18 0615 12/26/18 0209  WBC 3.5* 1.9* 5.5 5.0  NEUTROABS 2.9 1.2* 4.4 4.3  HGB 14.9 14.8 13.1 13.4  HCT 42.8 44.1 38.8* 39.8  MCV 90.3 90.6 90.9 90.7  PLT 148* 183 212 241   Basic Metabolic Panel: Recent Labs  Lab 12/23/18 1454 12/24/18 0730 12/25/18 0615 12/26/18 0209 12/27/18 0010  NA 135 132* 135 135 137  K 3.7 4.0 4.2 4.0 4.0  CL 99 101 103 101 102  CO2 24 19* 23 23 25   GLUCOSE 115* 279* 259* 284* 185*  BUN 12 21* 19 18 14   CREATININE 1.00 0.84 0.82 0.87 0.74  CALCIUM 8.6* 8.3* 8.7* 8.6* 8.6*  MG  --  2.3  --   --   --    GFR: Estimated Creatinine Clearance: 103.4 mL/min (by C-G formula based on SCr of 0.74 mg/dL). Liver Function Tests: Recent Labs  Lab 12/23/18 1454 12/24/18 0730 12/25/18 0615 12/26/18 0209 12/27/18 0010  AST 49* 33 28 32 91*  ALT 16 15 15 20  59*  ALKPHOS 58 58 56 60 58  BILITOT 1.3* 1.2 0.6 0.6 0.8  PROT 7.1 7.1 6.6 6.7 6.4*  ALBUMIN 3.5 3.4* 3.2* 3.4* 3.3*   Recent Labs  Lab 12/23/18 1505  LIPASE 22   No results for input(s): AMMONIA in the last 168 hours. Coagulation Profile: No results for input(s): INR, PROTIME in the last 168 hours. Cardiac Enzymes: No results for input(s): CKTOTAL, CKMB, CKMBINDEX, TROPONINI in the last 168 hours. BNP (last 3 results) No results for input(s): PROBNP in the last 8760 hours. HbA1C: No results for input(s): HGBA1C in the last 72 hours. CBG: Recent Labs  Lab 12/26/18 1127  12/26/18 1612 12/26/18 1647 12/26/18 2054 12/27/18 0821  GLUCAP 242* 55* 75 101* 168*   Lipid Profile: No results for input(s): CHOL, HDL, LDLCALC, TRIG, CHOLHDL, LDLDIRECT in the last 72 hours. Thyroid Function Tests: No results for input(s): TSH, T4TOTAL, FREET4, T3FREE, THYROIDAB in the last 72 hours. Anemia Panel: Recent Labs    12/26/18 0209 12/27/18 0010  FERRITIN 532* 512*      Radiology Studies: I have reviewed all of the imaging during this hospital visit personally     Scheduled Meds: . albuterol  2 puff Inhalation Q6H  . dexamethasone  6 mg Oral Q24H  . enoxaparin (LOVENOX) injection  40 mg Subcutaneous Q24H  . insulin aspart  0-15 Units Subcutaneous TID WC  . insulin aspart  3 Units Subcutaneous TID WC  . insulin glargine  10 Units Subcutaneous Daily  . vitamin C  500 mg Oral Daily  . zinc sulfate  220 mg Oral Daily   Continuous Infusions: . remdesivir 100 mg in NS 250 mL 100 mg (  12/26/18 1632)     LOS: 4 days        Jacob Aprea Gerome Apley, MD

## 2018-12-27 NOTE — Progress Notes (Signed)
Walking test done. Pt started on 2L and remained at 98% so O2 was turned off and sats stayed at 92% for remainder of walk. Now resting comfortably on room air 89-92%.

## 2018-12-27 NOTE — Discharge Summary (Signed)
Physician Discharge Summary  Evon SlackDavid A Bertram ZOX:[id number] DOB: 1960/04/02 DOA: 12/23/2018  PCP: System, Pcp Not In  Admit date: 12/23/2018 Discharge date: 12/28/2018  Admitted From: Home  Disposition:  Home   Recommendations for Outpatient Follow-up and new medication changes:  1. Follow up with Primary care in 7 days.  2. Please continue self quarantine for 2 weeks, use a mask in public and maintain physical distancing.   Home Health: no   Equipment/Devices: no    Discharge Condition: stable  CODE STATUS: full  Diet recommendation: Diabetic prudent.   Brief/Interim Summary: 58 year old male who presented with dyspnea and body aches, he does have significant past medical history for type 2 diabetes mellitus and dyslipidemia. He tested positive for SARS COVID-19 October 17, he reports persistent dyspnea, cough, andbody aches.On his initial physical examination blood pressure 144/90, heart rate 83-92, respiratory rate 21-31, oxygen saturation 91%.His lungs had rhonchi bilaterally, heart S1-S2 present and rhythmic, abdomen was soft, no lower extremity edema. Sodium 135, potassium 3.7, chloride 99, bicarb 24, glucose 115, BUN 12, creatinine 1.0, WBC 3.5, hemoglobin 14.9, hematocrit 42.8, platelets 148,SARS COVID-19 positive10/17.His chest x-ray had bilateral interstitial infiltrates, bilateralbases and right upper lobe, predominantly left lower lobe.  Patient was admitted to the hospital with a working diagnosis of SARS COVID-19 viral pneumonia complicated by acute hypoxic respiratory failure.  Patient has been responded well to the medical therapy with improving symptoms and oxygen requirements.Stable to discharge on 12/27/18, after completing last dose of Remdesivir.   1.  Acute hypoxic respiratory failure due to SARS COVID-19 viral pneumonia.  Patient was admitted to the medical ward, he received supplemental oxygen per nasal cannula, medical therapy with remdesivir and  systemic corticosteroids with dexamethasone.  Patient received guaifenesin, vitamin C and zinc. Through his hospitalization his symptoms along with inflammatory markers improved.  His oxygen saturation at discharge was 94% on room air.  He had an ambulatory pulse oximetry on room air before discharge, with no significant oxygen desaturation.   2.  Type 2 diabetes mellitus with steroid-induced hyperglycemia.  Patient received insulin, basal, sliding scale and meal coverage due to hyperglycemia likely related to systemic steroids.  His discharge fasting glucose is 222.  At discharge the steroids will be discontinued, patient will continue diet control at home and metformin.   3.  Transitory reactive leukopenia.  His cell count was closely monitored rise, his white cell count improved, at discharge 5.0.  Other cell lines within normal limits.  4.  Obesity.  Calculated BMI 29.4, will need outpatient follow-up.    Discharge Diagnoses:  Principal Problem:   Acute respiratory failure with hypoxia (HCC) Active Problems:   Type II diabetes mellitus with manifestations (HCC)   COVID-19 virus infection   Pneumonia due to COVID-19 virus    Discharge Instructions   Allergies as of 12/28/2018   No Known Allergies     Medication List    TAKE these medications   benzonatate 100 MG capsule Commonly known as: TESSALON Take 1 capsule (100 mg total) by mouth every 8 (eight) hours.   metFORMIN 500 MG tablet Commonly known as: GLUCOPHAGE Take 1 tablet (500 mg total) by mouth 2 (two) times daily with a meal.   ondansetron 4 MG disintegrating tablet Commonly known as: Zofran ODT Take 1 tablet (4 mg total) by mouth every 8 (eight) hours as needed for nausea or vomiting.      Follow-up Information    [facility] Follow up.  Why: You are scheduled for a telephonic hospital followup appointment on Monday, November 16,2020 at 1:50pm.  Please be available for this  appointment. If you have to cancel please call the number listed.     Contact information: [place] [address] [phone]         No Known Allergies  Consultations:     Procedures/Studies: Dg Chest Port 1 View  Result Date: 12/23/2018 CLINICAL DATA:  Cough, chest pain EXAM: PORTABLE CHEST 1 VIEW COMPARISON:  Radiograph Jul 07, 2016 FINDINGS: Multifocal patchy areas of airspace opacity throughout the lungs most pronounced in the left lung base. No pneumothorax or effusion. Cardiomediastinal contours are unremarkable. No acute osseous or soft tissue abnormality. IMPRESSION: Findings compatible with multifocal pneumonia including atypical viral etiologies such as COVID-19. Electronically Signed   By: Lovena Le M.D.   On: 12/23/2018 15:27   Dg Chest Portable 1 View  Result Date: 12/20/2018 CLINICAL DATA:  Syncopal episode.  Cough for the past month. EXAM: PORTABLE CHEST 1 VIEW COMPARISON:  07/07/2016; chest CT-08/28/2010 FINDINGS: Grossly unchanged cardiac silhouette and mediastinal contours given slightly reduced lung volumes and AP projection. Minimal bilateral infrahilar opacities favored to represent atelectasis. No discrete focal airspace opacities. No pleural effusion or pneumothorax. No evidence of edema. No acute osseous abnormalities. IMPRESSION: Bilateral infrahilar atelectasis without superimposed acute cardiopulmonary disease on this AP portable examination. Further evaluation with a PA and lateral chest radiograph may be obtained as clinically indicated. Electronically Signed   By: Sandi Mariscal M.D.   On: 12/20/2018 05:07      Procedures:   Subjective: Patient is feeling better, his dyspnea and cough have improved, no nausea or vomiting, tolerating po well.   Discharge Exam: Vitals:   12/27/18 0500 12/27/18 0536  BP:    Pulse: 82 72  Resp:    Temp:    SpO2: (!) 84% 92%   Vitals:   12/27/18 0300 12/27/18 0347  12/27/18 0500 12/27/18 0536  BP:  121/84    Pulse: 62 71 82 72  Resp: 18 18    Temp:  98.7 F (37.1 C)    TempSrc:  Oral    SpO2: 96% 96% (!) 84% 92%  Weight:      Height:        General: Not in pain or dyspnea  Neurology: Awake and alert, non focal  E ENT: no pallor, no icterus, oral mucosa moist Cardiovascular: No JVD. S1-S2 present, rhythmic, no gallops, rubs, or murmurs. No lower extremity edema. Pulmonary: positive breath sounds bilaterally. Gastrointestinal. Abdomen with no organomegaly, non tender, no rebound or guarding Skin. No rashes Musculoskeletal: no joint deformities   The results of significant diagnostics from this hospitalization (including imaging, microbiology, ancillary and laboratory) are listed below for reference.     Microbiology: Recent Results (from the past 240 hour(s))  SARS CORONAVIRUS 2 (TAT 6-24 HRS) Nasopharyngeal Nasopharyngeal Swab     Status: Abnormal   Collection Time: 12/20/18  5:23 AM   Specimen: Nasopharyngeal Swab  Result Value Ref Range Status   SARS Coronavirus 2 POSITIVE (A) NEGATIVE Final    Comment: RESULT CALLED TO, READ BACK BY AND VERIFIED WITH: JAMIE BLUE [email or web address]  ON 10.17.2020 BY TCALDWELL MT. (NOTE) SARS-CoV-2 target nucleic acids are DETECTED. The SARS-CoV-2 RNA is generally detectable in upper and lower respiratory specimens during the acute phase of infection. Positive results are indicative of active infection with SARS-CoV-2. Clinical  correlation with patient history and other diagnostic information is  necessary to determine patient infection status. Positive results do  not rule out bacterial infection or co-infection with other viruses. The expected result is Negative. Fact Sheet for Patients: HairSlick.no Fact Sheet for Healthcare Providers: quierodirigir.com This test is not yet approved or cleared by the [place] FDA and  has been authorized for  detection and/or diagnosis of SARS-CoV-2 by FDA under an Emergency Use Authorization (EUA). This EUA will remain  in effect (meaning this test can  be used) for the duration of the COVID-19 declaration under Section 564(b)(1) of the Act, 21 U.S.C. section 360bbb-3(b)(1), unless the authorization is terminated or revoked sooner. Performed at [facility], [address], [place], [place] [address]   Blood Culture (routine x 2)     Status: None (Preliminary result)   Collection Time: 12/23/18  4:20 PM   Specimen: BLOOD RIGHT HAND  Result Value Ref Range Status   Specimen Description BLOOD RIGHT HAND  Final   Special Requests   Final    BOTTLES DRAWN AEROBIC AND ANAEROBIC Blood Culture adequate volume   Culture   Final    NO GROWTH 4 DAYS Performed at [facility], [address] [place], [place] [address]    Report Status PENDING  Incomplete  Blood Culture (routine x 2)     Status: None (Preliminary result)   Collection Time: 12/23/18  4:35 PM   Specimen: BLOOD LEFT HAND  Result Value Ref Range Status   Specimen Description BLOOD LEFT HAND  Final   Special Requests   Final    BOTTLES DRAWN AEROBIC AND ANAEROBIC Blood Culture adequate volume   Culture   Final    NO GROWTH 4 DAYS Performed at [facility], [address], [place], [place] [address]    Report Status PENDING  Incomplete     Labs: BNP (last 3 results) No results for input(s): BNP in the last 8760 hours. Basic Metabolic Panel: Recent Labs  Lab 12/23/18 1454 12/24/18 0730 12/25/18 0615 12/26/18 0209 12/27/18 0010  NA 135 132* 135 135 137  K 3.7 4.0 4.2 4.0 4.0  CL 99 101 103 101 102  CO2 24 19* GLUCOSE 115* 279* 259* 284* 185*  BUN 12 21* CREATININE 1.00 0.84 0.82 0.87 0.74  CALCIUM 8.6* 8.3* 8.7* 8.6* 8.6*  MG  --  2.3  --   --   --    Liver Function Tests: Recent Labs  Lab 12/23/18 1454 12/24/18 0730 12/25/18 0615 12/26/18 0209 12/27/18 0010  AST 49* 33  28 32 91*  ALT 59*  ALKPHOS 58 58 56 60 58  BILITOT 1.3* 1.2 0.6 0.6 0.8  PROT 7.1 7.1 6.6 6.7 6.4*  ALBUMIN 3.5 3.4* 3.2* 3.4* 3.3*   Recent Labs  Lab 12/23/18 1505  LIPASE 22   No results for input(s): AMMONIA in the last 168 hours. CBC: Recent Labs  Lab 12/23/18 1454 12/24/18 0730 12/25/18 0615 12/26/18 0209  WBC 3.5* 1.9* 5.5 5.0  NEUTROABS 2.9 1.2* 4.4 4.3  HGB 14.9 14.8 13.1 13.4  HCT 42.8 44.1 38.8* 39.8  MCV 90.3 90.6 90.9 90.7  PLT 148* 183 212 241   Cardiac Enzymes: No results for input(s): CKTOTAL, CKMB, CKMBINDEX, TROPONINI in the last 168 hours. BNP: Invalid input(s): POCBNP CBG: Recent Labs  Lab 12/26/18 0756 12/26/18 1127 12/26/18 1612 12/26/18 1647 12/26/18 2054  GLUCAP 238* 242* 55* 75 101*  D-Dimer Recent Labs    12/26/18 0209 12/27/18 0010  DDIMER 0.68* 1.02*   Hgb A1c No results for input(s): HGBA1C in the last 72 hours. Lipid Profile No results for input(s): CHOL, HDL, LDLCALC, TRIG, CHOLHDL, LDLDIRECT in the last 72 hours. Thyroid function studies No results for input(s): TSH, T4TOTAL, T3FREE, THYROIDAB in the last 72 hours.  Invalid input(s): FREET3 Anemia work up Recent Labs    12/26/18 0209 12/27/18 0010  FERRITIN 532* 512*   Urinalysis    Component Value Date/Time   COLORURINE YELLOW 12/20/2018 0651   APPEARANCEUR CLEAR 12/20/2018 0651   LABSPEC 1.016 12/20/2018 0651   PHURINE 5.0 12/20/2018 0651   GLUCOSEU NEGATIVE 12/20/2018 0651   GLUCOSEU NEG mg/dL 81/03/7508 2585   HGBUR SMALL (A) 12/20/2018 0651   BILIRUBINUR NEGATIVE 12/20/2018 0651   KETONESUR 20 (A) 12/20/2018 0651   PROTEINUR 30 (A) 12/20/2018 0651   UROBILINOGEN 0.2 06/19/2013 1004   NITRITE NEGATIVE 12/20/2018 0651   LEUKOCYTESUR NEGATIVE 12/20/2018 0651   Sepsis Labs Invalid input(s): PROCALCITONIN,  WBC,  LACTICIDVEN Microbiology Recent Results (from the past 240 hour(s))  SARS CORONAVIRUS 2 (TAT 6-24 HRS) Nasopharyngeal  Nasopharyngeal Swab     Status: Abnormal   Collection Time: 12/20/18  5:23 AM   Specimen: Nasopharyngeal Swab  Result Value Ref Range Status   SARS Coronavirus 2 POSITIVE (A) NEGATIVE Final    Comment: RESULT CALLED TO, READ BACK BY AND VERIFIED WITH: JAMIE BLUE RN.@[email or web address]  ON 10.17.2020 BY TCALDWELL MT. (NOTE) SARS-CoV-2 target nucleic acids are DETECTED. The SARS-CoV-2 RNA is generally detectable in upper and lower respiratory specimens during the acute phase of infection. Positive results are indicative of active infection with SARS-CoV-2. Clinical  correlation with patient history and other diagnostic information is necessary to determine patient infection status. Positive results do  not rule out bacterial infection or co-infection with other viruses. The expected result is Negative. Fact Sheet for Patients: 10.19.2020 Fact Sheet for Healthcare Providers: HairSlick.no This test is not yet approved or cleared by the [place] FDA and  has been authorized for detection and/or diagnosis of SARS-CoV-2 by FDA under an Emergency Use Authorization (EUA). This EUA will remain  in effect (meaning this test can  be used) for the duration of the COVID-19 declaration under Section 564(b)(1) of the Act, 21 U.S.C. section 360bbb-3(b)(1), unless the authorization is terminated or revoked sooner. Performed at [facility], [address] [place], [place]   Blood Culture (routine x 2)     Status: None (Preliminary result)   Collection Time: 12/23/18  4:20 PM   Specimen: BLOOD RIGHT HAND  Result Value Ref Range Status   Specimen Description BLOOD RIGHT HAND  Final   Special Requests   Final    BOTTLES DRAWN AEROBIC AND ANAEROBIC Blood Culture adequate volume   Culture   Final    NO GROWTH 4 DAYS Performed at [facility], [address] [place], [place]    Report Status PENDING  Incomplete   Blood Culture (routine x 2)     Status: None (Preliminary result)   Collection Time: 12/23/18  4:35 PM   Specimen: BLOOD LEFT HAND  Result Value Ref Range Status   Specimen Description BLOOD LEFT HAND  Final   Special Requests   Final    BOTTLES DRAWN AEROBIC AND ANAEROBIC Blood Culture adequate volume   Culture   Final    NO GROWTH 4 DAYS Performed at [facility], [address], [place], [place] [address]    Report Status PENDING  Incomplete     Time coordinating discharge: 45 minutes  SIGNED:   Coralie Keens, MD  [facility] 12/27/2018, 7:48 AM

## 2018-12-28 LAB — COMPREHENSIVE METABOLIC PANEL
ALT: 56 U/L — ABNORMAL HIGH (ref 0–44)
AST: 48 U/L — ABNORMAL HIGH (ref 15–41)
Albumin: 3.2 g/dL — ABNORMAL LOW (ref 3.5–5.0)
Alkaline Phosphatase: 65 U/L (ref 38–126)
Anion gap: 11 (ref 5–15)
BUN: 16 mg/dL (ref 6–20)
CO2: 24 mmol/L (ref 22–32)
Calcium: 8.5 mg/dL — ABNORMAL LOW (ref 8.9–10.3)
Chloride: 100 mmol/L (ref 98–111)
Creatinine, Ser: 0.8 mg/dL (ref 0.61–1.24)
GFR calc Af Amer: >60 mL/min (ref >60)
GFR calc non Af Amer: >60 mL/min (ref >60)
Glucose, Bld: 222 mg/dL — ABNORMAL HIGH (ref 70–99)
Potassium: 4 mmol/L (ref 3.5–5.1)
Sodium: 135 mmol/L (ref 135–145)
Total Bilirubin: 1.1 mg/dL (ref 0.3–1.2)
Total Protein: 6.5 g/dL (ref 6.5–8.1)

## 2018-12-28 LAB — CULTURE, BLOOD (ROUTINE X 2)
Culture: NO GROWTH
Culture: NO GROWTH
Special Requests: ADEQUATE
Special Requests: ADEQUATE

## 2018-12-28 LAB — C-REACTIVE PROTEIN: CRP: 8.2 mg/dL — ABNORMAL HIGH (ref <1.0)

## 2018-12-28 LAB — D-DIMER, QUANTITATIVE: D-Dimer, Quant: 1.04 ug/mL-FEU — ABNORMAL HIGH (ref 0.00–0.50)

## 2018-12-28 LAB — GLUCOSE, CAPILLARY: Glucose-Capillary: 197 mg/dL — ABNORMAL HIGH (ref 70–99)

## 2018-12-28 LAB — FERRITIN: Ferritin: 520 ng/mL — ABNORMAL HIGH (ref 24–336)

## 2018-12-28 MED ORDER — METFORMIN HCL 500 MG PO TABS: 500.0000 mg | ORAL_TABLET | Freq: Two times a day (BID) | ORAL | 0 refills | Status: AC

## 2018-12-28 MED ORDER — METFORMIN HCL 500 MG PO TABS: 500.0000 mg | ORAL_TABLET | Freq: Two times a day (BID) | ORAL | Status: DC

## 2018-12-28 NOTE — Discharge Planning (Signed)
Patient IV removed. RN assessment and VS revealed stability for DC to home.  Discussed discharge contract, signed and on chart.  Understands plan to self isolate 2 weeks beyond DC and continue to monitor s/sx (diff breathing, increased weakness and temp).  Printed DC papers, explained and educated.  Scripts sent to pharm (per patient request).  Told to FU with PCP towards end of 2 weeks.  Once ready, will  Be wheeled to front and family transporting home via car.

## 2019-01-06 ENCOUNTER — Other Ambulatory Visit: Payer: Self-pay

## 2019-01-06 DIAGNOSIS — Z20822 Contact with and (suspected) exposure to covid-19: Secondary | ICD-10-CM

## 2019-01-07 LAB — NOVEL CORONAVIRUS, NAA: SARS-CoV-2, NAA: NOT DETECTED

## 2019-01-08 ENCOUNTER — Telehealth: Payer: Self-pay

## 2019-01-08 NOTE — Telephone Encounter (Signed)
Patient called in and received his covid test result °

## 2019-01-19 ENCOUNTER — Other Ambulatory Visit: Payer: Self-pay

## 2019-01-19 ENCOUNTER — Ambulatory Visit (INDEPENDENT_AMBULATORY_CARE_PROVIDER_SITE_OTHER): Payer: HRSA Program | Admitting: Primary Care

## 2019-01-19 DIAGNOSIS — R05 Cough: Secondary | ICD-10-CM

## 2019-01-19 DIAGNOSIS — R0602 Shortness of breath: Secondary | ICD-10-CM | POA: Diagnosis not present

## 2019-01-19 DIAGNOSIS — U071 COVID-19: Secondary | ICD-10-CM | POA: Diagnosis not present

## 2019-01-19 DIAGNOSIS — Z09 Encounter for follow-up examination after completed treatment for conditions other than malignant neoplasm: Secondary | ICD-10-CM | POA: Diagnosis not present

## 2019-01-19 MED ORDER — BENZONATATE 100 MG PO CAPS: 100.0000 mg | ORAL_CAPSULE | Freq: Three times a day (TID) | ORAL | 0 refills | Status: AC

## 2019-01-19 MED ORDER — ALBUTEROL SULFATE HFA 108 (90 BASE) MCG/ACT IN AERS: 2.0000 | INHALATION_SPRAY | Freq: Four times a day (QID) | RESPIRATORY_TRACT | 1 refills | Status: AC | PRN

## 2019-01-19 NOTE — Progress Notes (Signed)
Virtual Visit via Telephone Note  I connected with Jacob Durham on 01/19/19 at  1:50 PM EST by telephone and verified that I am speaking with the correct person using two identifiers.   I discussed the limitations, risks, security and privacy concerns of performing an evaluation and management service by telephone and the availability of in person appointments. I also discussed with the patient that there may be a patient responsible charge related to this service. The patient expressed understanding and agreed to proceed.   History of Present Illness: Jacob Durham is having a tele visit for hospital discharge for COVID positive. We discussed not taking care of his self he is a diabetic previously followed by internal  medicine residence program. He is to called and follow up for re-establishment of care.  Past Medical History:  Diagnosis Date   Chronic back pain    Diabetes mellitus    Diabetic cataract(366.41)    Erectile dysfunction    Hyperlipidemia    Meds ordered this encounter   Meds ordered this encounter  Medications   benzonatate (TESSALON) 100 MG capsule    Sig: Take 1 capsule (100 mg total) by mouth every 8 (eight) hours.    Dispense:  30 capsule    Refill:  0   albuterol (VENTOLIN HFA) 108 (90 Base) MCG/ACT inhaler    Sig: Inhale 2 puffs into the lungs every 6 (six) hours as needed for wheezing or shortness of breath.    Dispense:  6.7 g    Refill:  1     Observations/Objective: Review of Systems  Respiratory: Positive for cough and shortness of breath.   All other systems reviewed and are negative.   Assessment and Plan:  Jacob Durham was seen today for hospitalization follow-up.  Diagnoses and all orders for this visit:  COVID-19 virus infection    Person Under Monitoring Name: LAWSON MAHONE  Location: [address] [place] [address]   Infection Prevention Recommendations for Individuals Confirmed to have, or Being Evaluated for, 2019  Novel Coronavirus (COVID-19) Infection Who Receive Care at Home  Individuals who are confirmed to have, or are being evaluated for, COVID-19 should follow the prevention steps below until a healthcare provider or local or state health department says they can return to normal activities.  Stay home except to get medical care You should restrict activities outside your home, except for getting medical care. Do not go to work, school, or public areas, and do not use public transportation or taxis.  Call ahead before visiting your doctor Before your medical appointment, call the healthcare provider and tell them that you have, or are being evaluated for, COVID-19 infection. This will help the healthcare providers office take steps to keep other people from getting infected. Ask your healthcare provider to call the local or state health department.  Monitor your symptoms Seek prompt medical attention if your illness is worsening (e.g., difficulty breathing). Before going to your medical appointment, call the healthcare provider and tell them that you have, or are being evaluated for, COVID-19 infection. Ask your healthcare provider to call the local or state health department.  Wear a facemask You should wear a facemask that covers your nose and mouth when you are in the same room with other people and when you visit a healthcare provider. People who live with or visit you should also wear a facemask while they are in the same room with you.  Separate yourself from other people in your  home As much as possible, you should stay in a different room from other people in your home. Also, you should use a separate bathroom, if available.  Avoid sharing household items You should not share dishes, drinking glasses, cups, eating utensils, towels, bedding, or other items with other people in your home. After using these items, you should wash them thoroughly with soap and water.  Cover your  coughs and sneezes Cover your mouth and nose with a tissue when you cough or sneeze, or you can cough or sneeze into your sleeve. Throw used tissues in a lined trash can, and immediately wash your hands with soap and water for at least 20 seconds or use an alcohol-based hand rub.  Wash your [employer] your hands often and thoroughly with soap and water for at least 20 seconds. You can use an alcohol-based hand sanitizer if soap and water are not available and if your hands are not visibly dirty. Avoid touching your eyes, nose, and mouth with unwashed hands.   Prevention Steps for Caregivers and Household Members of Individuals Confirmed to have, or Being Evaluated for, COVID-19 Infection Being Cared for in the Home  If you live with, or provide care at home for, a person confirmed to have, or being evaluated for, COVID-19 infection please follow these guidelines to prevent infection:  Follow healthcare providers instructions Make sure that you understand and can help the patient follow any healthcare provider instructions for all care.  Provide for the patients basic needs You should help the patient with basic needs in the home and provide support for getting groceries, prescriptions, and other personal needs.  Monitor the patients symptoms If they are getting sicker, call his or her medical provider and tell them that the patient has, or is being evaluated for, COVID-19 infection. This will help the healthcare providers office take steps to keep other people from getting infected. Ask the healthcare provider to call the local or state health department.  Limit the number of people who have contact with the patient  If possible, have only one caregiver for the patient.  Other household members should stay in another home or place of residence. If this is not possible, they should stay  in another room, or be separated from the patient as much as possible. Use a separate  bathroom, if available.  Restrict visitors who do not have an essential need to be in the home.  Keep older adults, very young children, and other sick people away from the patient Keep older adults, very young children, and those who have compromised immune systems or chronic health conditions away from the patient. This includes people with chronic heart, lung, or kidney conditions, diabetes, and cancer.  Ensure good ventilation Make sure that shared spaces in the home have good air flow, such as from an air conditioner or an opened window, weather permitting.  Wash your hands often  Wash your hands often and thoroughly with soap and water for at least 20 seconds. You can use an alcohol based hand sanitizer if soap and water are not available and if your hands are not visibly dirty.  Avoid touching your eyes, nose, and mouth with unwashed hands.  Use disposable paper towels to dry your hands. If not available, use dedicated cloth towels and replace them when they become wet.  Wear a facemask and gloves  Wear a disposable facemask at all times in the room and gloves when you touch or have contact with the  patients blood, body fluids, and/or secretions or excretions, such as sweat, saliva, sputum, nasal mucus, vomit, urine, or feces.  Ensure the mask fits over your nose and mouth tightly, and do not touch it during use.  Throw out disposable facemasks and gloves after using them. Do not reuse.  Wash your hands immediately after removing your facemask and gloves.  If your personal clothing becomes contaminated, carefully remove clothing and launder. Wash your hands after handling contaminated clothing.  Place all used disposable facemasks, gloves, and other waste in a lined container before disposing them with other household waste.  Remove gloves and wash your hands immediately after handling these items.  Do not share dishes, glasses, or other household items with the  patient  Avoid sharing household items. You should not share dishes, drinking glasses, cups, eating utensils, towels, bedding, or other items with a patient who is confirmed to have, or being evaluated for, COVID-19 infection.  After the person uses these items, you should wash them thoroughly with soap and water.  Wash laundry thoroughly  Immediately remove and wash clothes or bedding that have blood, body fluids, and/or secretions or excretions, such as sweat, saliva, sputum, nasal mucus, vomit, urine, or feces, on them.  Wear gloves when handling laundry from the patient.  Read and follow directions on labels of laundry or clothing items and detergent. In general, wash and dry with the warmest temperatures recommended on the label.  Clean all areas the individual has used often  Clean all touchable surfaces, such as counters, tabletops, doorknobs, bathroom fixtures, toilets, phones, keyboards, tablets, and bedside tables, every day. Also, clean any surfaces that may have blood, body fluids, and/or secretions or excretions on them.  Wear gloves when cleaning surfaces the patient has come in contact with.  Use a diluted bleach solution (e.g., dilute bleach with 1 part bleach and 10 parts water) or a household disinfectant with a label that says EPA-registered for coronaviruses. To make a bleach solution at home, add 1 tablespoon of bleach to 1 quart (4 cups) of water. For a larger supply, add  cup of bleach to 1 gallon (16 cups) of water.  Read labels of cleaning products and follow recommendations provided on product labels. Labels contain instructions for safe and effective use of the cleaning product including precautions you should take when applying the product, such as wearing gloves or eye protection and making sure you have good ventilation during use of the product.  Remove gloves and wash hands immediately after cleaning.  Monitor yourself for signs and symptoms of  illness Caregivers and household members are considered close contacts, should monitor their health, and will be asked to limit movement outside of the home to the extent possible. Follow the monitoring steps for close contacts listed on the symptom monitoring form. ? If you have additional questions, contact your local health department or call the epidemiologist on call at [phone] (available 24/7). ? This guidance is subject to change. For the most up-to-date guidance from [facility], please refer to their website: TripMetro.huhttps://www.cdc.gov/coronavirus/2019-ncov/hcp/guidance-prevent-spread.html -     benzonatate (TESSALON) 100 MG capsule; Take 1 capsule (100 mg total) by mouth every 8 (eight) hours. -     albuterol (VENTOLIN HFA) 108 (90 Base) MCG/ACT inhaler; Inhale 2 puffs into the lungs every 6 (six) hours as needed for wheezing or shortness of breath.  Hospital discharge follow-up 1. His chest x-ray had bilateral interstitial infiltrates, bilateralbases and right upper lobe, predominantly left lower lobe. Recommended to follow  up in 7 days with PCP and self quarantine for 2 weeks, use a mask in public and maintain physical distancing.    Follow Up Instructions:    I discussed the assessment and treatment plan with the patient. The patient was provided an opportunity to ask questions and all were answered. The patient agreed with the plan and demonstrated an understanding of the instructions.   The patient was advised to call back or seek an in-person evaluation if the symptoms worsen or if the condition fails to improve as anticipated.  I provided 15 minutes of non-face-to-face time during this encounter.   Kerin Perna, NP

## 2019-01-19 NOTE — Progress Notes (Signed)
Would like refill of tessalon pearls and inhaler

## 2019-09-05 ENCOUNTER — Emergency Department (HOSPITAL_COMMUNITY)
Admission: EM | Admit: 2019-09-05 | Discharge: 2019-09-05 | Disposition: A | Discharge status: Home / Self Care | Payer: Commercial Managed Care - PPO | Attending: Emergency Medicine | Admitting: Emergency Medicine

## 2019-09-05 ENCOUNTER — Encounter (HOSPITAL_COMMUNITY): Payer: Self-pay | Admitting: Emergency Medicine

## 2019-09-05 ENCOUNTER — Emergency Department (HOSPITAL_COMMUNITY): Payer: Commercial Managed Care - PPO

## 2019-09-05 ENCOUNTER — Other Ambulatory Visit: Payer: Self-pay

## 2019-09-05 DIAGNOSIS — E119 Type 2 diabetes mellitus without complications: Secondary | ICD-10-CM | POA: Diagnosis not present

## 2019-09-05 DIAGNOSIS — M545 Low back pain, unspecified: Secondary | ICD-10-CM

## 2019-09-05 DIAGNOSIS — Z87891 Personal history of nicotine dependence: Secondary | ICD-10-CM | POA: Diagnosis not present

## 2019-09-05 DIAGNOSIS — M25561 Pain in right knee: Secondary | ICD-10-CM | POA: Insufficient documentation

## 2019-09-05 MED ORDER — KETOROLAC TROMETHAMINE 60 MG/2ML IM SOLN
60.0000 mg | Freq: Once | INTRAMUSCULAR | Status: AC
  Administered 2019-09-05: 60 mg via INTRAMUSCULAR
  Filled 2019-09-05: qty 2

## 2019-09-05 MED ORDER — METHOCARBAMOL 500 MG PO TABS: 500.0000 mg | ORAL_TABLET | Freq: Two times a day (BID) | ORAL | 0 refills | Status: AC

## 2019-09-05 MED ORDER — METHOCARBAMOL 500 MG PO TABS
500.0000 mg | ORAL_TABLET | Freq: Once | ORAL | Status: AC
  Administered 2019-09-05: 500 mg via ORAL
  Filled 2019-09-05: qty 1

## 2019-09-05 NOTE — ED Triage Notes (Signed)
Pt. Stated, I was playing with my grand children and I twisted my rt. Knee and hurt my back.

## 2019-09-05 NOTE — Discharge Instructions (Addendum)
Your work-up today was overall reassuring.  Please take over-the-counter Tylenol or ibuprofen for pain.  Please take the muscle relaxer prescribed at night, do not drink or drive on this medication as it does not make you sleepy.  I have also attached a handout for low back stretches.  Please follow-up with orthopedics if your symptoms not improve.  Return to the ER if your symptoms worsen.

## 2019-09-05 NOTE — ED Provider Notes (Signed)
[facility] EMERGENCY DEPARTMENT Provider Note   CSN: [id number] Arrival date & time: 09/05/19  1149     History Chief Complaint  Patient presents with  . Knee Pain  . Back Pain    Jacob Durham is a 59 y.o. male.  HPI 59 year old male with history of DM type II, back pain, hyperlipidemia, chronic back pain presents to the ER for increasing back pain and right knee pain.  Patient states that he was playing badminton with his grandchildren yesterday when he twisted his knee and fell to the ground.  Denies any head injury or LOC.  He also states that he felt a pull in his back.  He has been able to bear weight since the fall and ambulate, though this is painful.  He denies any radiation into the legs and feet, numbness, tingling, groin numbness, inability to hold bowel and bladder, fevers or chills, night sweats.  No history of IVDU.  He states he has a history of low back pain.  He has not taken anything for his symptoms.     Past Medical History:  Diagnosis Date  . Chronic back pain   . Diabetes mellitus   . Diabetic cataract(366.41)   . Erectile dysfunction   . Hyperlipidemia     Patient Active Problem List   Diagnosis Date Noted  . Pneumonia due to COVID-19 virus 12/24/2018  . COVID-19 virus infection 12/23/2018  . Acute respiratory failure with hypoxia (HCC) 12/23/2018  . Urticaria 01/04/2015  . Health care maintenance 11/06/2011  . HLD (hyperlipidemia) 09/27/2008  . ERECTILE DYSFUNCTION, NON-ORGANIC 09/27/2008  . Diabetic cataract (HCC) 09/27/2008  . BACK PAIN, CHRONIC 09/27/2008  . Type II diabetes mellitus with manifestations (HCC) 03/06/1999    Past Surgical History:  Procedure Laterality Date  . CATARACT EXTRACTION  2007       Family History  Problem Relation Age of Onset  . Diabetes Maternal Grandmother   . Hypertension Maternal Grandmother     Social History   Tobacco Use  . Smoking status: Former Smoker    Packs/day: 0.25     Years: 5.00    Pack years: 1.25    Types: Cigarettes    Quit date: 03/07/2001    Years since quitting: 18.5  . Smokeless tobacco: Never Used  Substance Use Topics  . Alcohol use: Yes    Alcohol/week: 14.0 standard drinks    Types: 14 Cans of beer per week  . Drug use: Yes    Types: Marijuana    Home Medications Prior to Admission medications   Medication Sig Start Date End Date Taking? Authorizing Provider  albuterol (VENTOLIN HFA) 108 (90 Base) MCG/ACT inhaler Inhale 2 puffs into the lungs every 6 (six) hours as needed for wheezing or shortness of breath. 01/19/19   Grayce Sessions, NP  benzonatate (TESSALON) 100 MG capsule Take 1 capsule (100 mg total) by mouth every 8 (eight) hours. 01/19/19   Grayce Sessions, NP  metFORMIN (GLUCOPHAGE) 500 MG tablet Take 1 tablet (500 mg total) by mouth 2 (two) times daily with a meal. 12/28/18 01/27/19  Arrien, York Ram, MD    Allergies    Patient has no known allergies.  Review of Systems   Review of Systems  Constitutional: Negative for chills and fever.  Musculoskeletal: Positive for arthralgias and back pain. Negative for neck pain and neck stiffness.  Neurological: Negative for weakness and numbness.    Physical Exam Updated Vital Signs BP 101/90  Pulse 87   Temp 98.5 F (36.9 C)   Resp (!) 22   SpO2 98%   Physical Exam Vitals and nursing note reviewed.  Constitutional:      Appearance: Normal appearance. He is well-developed.  HENT:     Head: Normocephalic and atraumatic.  Eyes:     Conjunctiva/sclera: Conjunctivae normal.  Cardiovascular:     Rate and Rhythm: Normal rate and regular rhythm.     Heart sounds: No murmur heard.   Pulmonary:     Effort: Pulmonary effort is normal. No respiratory distress.     Breath sounds: Normal breath sounds.  Abdominal:     Palpations: Abdomen is soft.     Tenderness: There is no abdominal tenderness.  Musculoskeletal:        General: Tenderness present. No  deformity.     Cervical back: Neck supple.     Comments: No C, T, L-spine tenderness.  5/5 strength in upper and lower extremities.  Paraspinal muscle tenderness to the L-spine bilaterally.  Tenderness to palpation to the anterior portion of the knee.  Negative Lachman's.  No noticeable step-offs, crepitus, fluctuance, erythema.  Sensations intact.  Full range of motion and strength of neck.  Full range of motion of knee.  No noticeable effusion, erythema, deformity.  Moving all 4 extremities without difficulty.    Skin:    General: Skin is warm and dry.  Neurological:     General: No focal deficit present.     Mental Status: He is alert.     Sensory: No sensory deficit.     Motor: No weakness.     ED Results / Procedures / Treatments   Labs (all labs ordered are listed, but only abnormal results are displayed) Labs Reviewed - No data to display  EKG None  Radiology No results found.  Procedures Procedures (including critical care time)  Medications Ordered in ED Medications  ketorolac (TORADOL) injection 60 mg (60 mg Intramuscular Given 09/05/19 1242)  methocarbamol (ROBAXIN) tablet 500 mg (500 mg Oral Given 09/05/19 1242)    ED Course  I have reviewed the triage vital signs and the nursing notes.  Pertinent labs & imaging results that were available during my care of the patient were reviewed by me and considered in my medical decision making (see chart for details).    MDM Rules/Calculators/A&P                          59 year old male with exacerbation of low back pain and right knee pain after playing with his grandchildren yesterday On presentation, patient is alert and oriented, nontoxic-appearing, no acute distress resting comfortably in the ER bed.  Vitals overall reassuring.  Physical exam with no noticeable effusion, deformity to the right knee, no evidence of cellulitis, septic joint, gout.  Low back without midline tenderness, denies any groin numbness, bowel  bladder incontinence, numbness or tingling.  He has paraspinal muscle tenderness consistent with low back strain.  Plain films of right knee without evidence of fracture.  Patient received Toradol and Robaxin in the ED, will send home with prescription of Robaxin as well.  Patient educated on sedating side effects of this medication, encouraged to not drink or drive on this medication and to take it at night.  Referral to Ortho provided if symptoms not improved.  Overall, doubt cauda equina, disc herniation.  My suspicion for torn ACL/MCL is low, however this can be further evaluated in nonurgent  setting.  Return precautions given.  All the patient's questions have been answered to satisfaction, he voices understanding and is agreeable to this plan.  At this stage in the ED course, the patient is medically screened and stable for discharge. Final Clinical Impression(s) / ED Diagnoses Final diagnoses:  Bilateral low back pain without sciatica, unspecified chronicity  Acute pain of right knee    Rx / DC Orders ED Discharge Orders    None       Mare Ferrari, PA-C 09/16/19 1306    Melene Plan, DO 09/16/19 1507

## 2019-09-05 NOTE — Progress Notes (Signed)
Orthopedic Tech Progress Note Patient Details:  Jacob Durham 10/26/1960 [id number] Applied knee sleeve to patient Ortho Devices Type of Ortho Device: Knee Sleeve Ortho Device/Splint Location: RLE Ortho Device/Splint Interventions: Ordered, Application   Post Interventions Patient Tolerated: Well Instructions Provided: Care of device, Adjustment of device   Rogenia Werntz 09/05/2019, 1:21 PM

## 2021-10-19 ENCOUNTER — Encounter

## 2021-11-30 ENCOUNTER — Encounter

## 2021-12-06 ENCOUNTER — Ambulatory Visit: Payer: MEDICARE

## 2021-12-27 MED ORDER — LISINOPRIL 10 MG PO TABS
10 MG | ORAL_TABLET | Freq: Every day | ORAL | 0 refills | Status: AC
Start: 2021-12-27 — End: ?

## 2021-12-27 NOTE — Telephone Encounter (Signed)
No pcp listed, I tried contacting pt but phone is disconnected. Pt has never been seen here, I see Johnny Matthews has filled in October- please advise?

## 2021-12-30 ENCOUNTER — Inpatient Hospital Stay: Payer: MEDICARE

## 2022-04-13 MED ORDER — POLYETHYLENE GLYCOL 3350 17 GM/SCOOP PO POWD
17 GM/SCOOP | ORAL | 0 refills | Status: AC
Start: 2022-04-13 — End: 2022-04-26

## 2022-04-13 MED ORDER — BISACODYL 5 MG PO TBEC
5 MG | ORAL_TABLET | ORAL | 0 refills | Status: AC
Start: 2022-04-13 — End: 2022-04-26

## 2022-04-26 ENCOUNTER — Inpatient Hospital Stay: Payer: MEDICARE | Attending: Gastroenterology

## 2022-04-26 MED ORDER — LIDOCAINE HCL (PF) 2 % IJ SOLN
2 | INTRAMUSCULAR | Status: DC | PRN
Start: 2022-04-26 — End: 2022-04-26
  Administered 2022-04-26: 13:00:00 40 via INTRAVENOUS

## 2022-04-26 MED ORDER — LIDOCAINE HCL 100 MG/5ML IJ SOSY
100 | INTRAMUSCULAR | Status: AC
Start: 2022-04-26 — End: ?

## 2022-04-26 MED ORDER — LIDOCAINE HCL (PF) 1 % IJ SOLN
1 | Freq: Once | INTRAMUSCULAR | Status: DC | PRN
Start: 2022-04-26 — End: 2022-04-26

## 2022-04-26 MED ORDER — PROPOFOL 500 MG/50ML IV EMUL
500 | INTRAVENOUS | Status: DC | PRN
Start: 2022-04-26 — End: 2022-04-26
  Administered 2022-04-26: 13:00:00 200 via INTRAVENOUS

## 2022-04-26 MED ORDER — LACTATED RINGERS IV SOLN
INTRAVENOUS | Status: DC
Start: 2022-04-26 — End: 2022-04-26
  Administered 2022-04-26: 12:00:00 via INTRAVENOUS

## 2022-04-26 MED FILL — LACTATED RINGERS IV SOLN: INTRAVENOUS | Qty: 1000

## 2022-04-26 MED FILL — LIDOCAINE HCL 100 MG/5ML IJ SOSY: 100 MG/5ML | INTRAMUSCULAR | Qty: 5

## 2022-04-26 NOTE — Discharge Instructions (Addendum)
You did well during your colonoscopy today.  I removed 2 small polyps from your colon.  I will send these to the pathologist and notify you of results and when you should have your next colonoscopy.  You also have internal hemorrhoids.  I recommend a high-fiber diet.  [facility]  [address] [facility] Phone Number: [phone]      Date: 04/26/22    Patient: Johnny Matthews,  Age: 62 y.o.,  Sex: male, (DOB:09/16/1960)     COLONOSCOPY DISCHARGE INSTRUCTIONS    The physician performing your procedure today is Dr.Gakllagher [phone]    Important:  As you prepare for discharge, the following information will help you return to your best level of health.    If you are a smoker, we encourage you to explore smoking cessation therapies.  For assistance/further information, please call [phone].    If the symptoms of your disease worsen, please contact your primary Care Provider.    This information is About Your Procedure    COLONOSCOPY (Examination of the large bowel)  The doctor used an instrument to check the inside of your large intestine (bowel).  This test is done for many reasons.  Your doctor has talked to you about the need for this test.  Sometimes polyps (tiny growths) are removed during the test.    Follow these instructions:  Rest today and slowly increase your activity  Do not drive or operate heavy machinery for the next 24 hours  Walking will help you get rid of the gas  You might have stomach cramps and pass more gas today  You may notice a small amount of blood in your stool    Call your doctor if you have:  Severe abdominal pain or bloating of your abdomen  Weakness  Blood from your rectum or in your stool that increases or persists  Any new problems or concerns    Moderate Sedation  You have had a procedure that required some medicine to reduce anxiety and pain.  This medicine (or medicines) is called moderate sedation.  After receiving the medicine, you may be sleepy, but  able to breathe on your own.  The effects of the medicine may last for several hours.    Follow these instructions after moderate sedation:  Do not drink alcohol, drive or operate machinery for 24 hours following the Endoscopy  Do not do anything where dizziness or clumsiness would be dangerous  Do not make important decisions or sign any legal documents for the next 24 hours    Page 2        COLONOSCOPY DISCHARGE INSTRUCTIONS    Go directly home.  Rest quietly at home today, then you can be up and about  Make sure A RESPONSIBLE PERSON stays with you the rest of today and overnight for your protection and safety  Resume home medications unless otherwise instructed  Start your diet with liquids and light foods (jello, soup, juice, toast).  Then, gradually progress the diet if you are not nauseated  Call your doctor if you have:  Excess sleepiness  Continuous vomiting  Trouble breathing  Any new problems or concerns   Dentures     Yes    No    Eyeglasses    Yes   No     We provide this literature for patients and family members.  It is intended to be educational supplement that highlights some of the important points of  what we have previously discussed.

## 2022-04-26 NOTE — Anesthesia Pre-Procedure Evaluation (Signed)
Department of Anesthesiology  Preprocedure Note       Name:  Johnny Matthews   Age:  62 y.o.  DOB:  September 18, 1960                                          MRN:  [id number]         Date:  04/26/2022      Surgeon: Juliann Mule):  Ernst Bowler Fanny Bien, MD    Procedure: Procedure(s):  COLORECTAL CANCER SCREENING, NOT HIGH RISK    Medications prior to admission:   Prior to Admission medications    Medication Sig Start Date End Date Taking? Authorizing Provider   lisinopril (PRINIVIL;ZESTRIL) 10 MG tablet TAKE ONE TABLET BY MOUTH DAILY 12/27/21   [place], Wandra Scot, APRN - NP       Current medications:    Current Facility-Administered Medications   Medication Dose Route Frequency Provider Last Rate Last Admin   . lidocaine PF 1 % injection 1 mL  1 mL IntraDERmal Once PRN Kaylani Fromme, Clemencia Course, MD       . lactated ringers IV soln infusion   IntraVENous Continuous Tabor Bartram, Clemencia Course, MD           Allergies:  No Known Allergies    Problem List:  There is no problem list on file for this patient.      Past Medical History:  No past medical history on file.    Past Surgical History:  No past surgical history on file.    Social History:    Social History     Tobacco Use   . Smoking status: Not on file   . Smokeless tobacco: Not on file   Substance Use Topics   . Alcohol use: Not on file                                Counseling given: Not Answered      Vital Signs (Current):   Vitals:    04/26/22 0654   BP: 127/76   Pulse: 69   Resp: 18   Temp: 97.3 F (36.3 C)   TempSrc: Temporal   SpO2: 96%   Weight: 76.7 kg (169 lb 1.5 oz)   Height: 1.746 m (5' 8.75")                                              BP Readings from Last 3 Encounters:   04/26/22 127/76       NPO Status:                                                                                 BMI:   Wt Readings from Last 3 Encounters:   04/26/22 76.7 kg (169 lb 1.5 oz)     Body mass index is 25.15 kg/m.    CBC: No results found for: "WBC", "RBC", "HGB", "HCT",  "  MCV", "RDW", "PLT"    CMP: No results found for: "NA", "K", "CL", "CO2", "BUN", "CREATININE", "GFRAA", "AGRATIO", "LABGLOM", "GLUCOSE", "GLU", "PROT", "CALCIUM", "BILITOT", "ALKPHOS", "AST", "ALT"    POC Tests: No results for input(s): "POCGLU", "POCNA", "POCK", "POCCL", "POCBUN", "POCHEMO", "POCHCT" in the last 72 hours.    Coags: No results found for: "PROTIME", "INR", "APTT"    HCG (If Applicable): No results found for: "PREGTESTUR", "PREGSERUM", "HCG", "HCGQUANT"     ABGs: No results found for: "PHART", "PO2ART", "PCO2ART", "HCO3ART", "BEART", "O2SATART"     Type & Screen (If Applicable):  No results found for: "LABABO", "LABRH"    Drug/Infectious Status (If Applicable):  No results found for: "HIV", "HEPCAB"    COVID-19 Screening (If Applicable): No results found for: "COVID19"        Anesthesia Evaluation  Patient summary reviewed  Airway: Mallampati: II  TM distance: <3 FB   Neck ROM: full  Mouth opening: > = 3 FB   Dental:    (+) lower dentures and upper dentures      Pulmonary:normal exam                               Cardiovascular:    (+) hypertension:                  Neuro/Psych:               GI/Hepatic/Renal:             Endo/Other:                     Abdominal: normal exam            Vascular:          Other Findings:       Anesthesia Plan      TIVA     ASA 2       Induction: intravenous.  continuous noninvasive hemodynamic monitor    Anesthetic plan and risks discussed with patient.        Attending anesthesiologist reviewed and agrees with Preprocedure content            Margaretann Loveless, MD   04/26/2022

## 2022-04-26 NOTE — Discharge Summary (Signed)
Patient tolerated procedure well, patient is stable, please see discharge sheet for meds.  Patient meets criteria following surgery for discharge.

## 2022-04-26 NOTE — Anesthesia Post-Procedure Evaluation (Signed)
Department of Anesthesiology  Postprocedure Note    Patient: Johnny Matthews  MRN: [id number]  Birthdate: [birth year]  Date of evaluation: 04/26/2022    Procedure Summary       Date: 04/26/22 Room / Location: [facility] / [facility]    Anesthesia Start: 0803 Anesthesia Stop: [id number]    Procedures:       COLORECTAL CANCER SCREENING, NOT HIGH RISK      COLONOSCOPY POLYPECTOMY SNARE/COLD BIOPSY Diagnosis: Screen for colon cancer    Surgeons: Duard Larsen, MD Responsible Provider: Margaretann Loveless, MD    Anesthesia Type: TIVA ASA Status: 2            Anesthesia Type: TIVA    Aldrete Phase I:      Aldrete Phase II:      Anesthesia Post Evaluation    Patient location during evaluation: bedside  Patient participation: complete - patient participated  Level of consciousness: awake  Airway patency: patent  Cardiovascular status: blood pressure returned to baseline  Respiratory status: acceptable  Hydration status: euvolemic  Pain management: adequate    No notable events documented.

## 2022-04-26 NOTE — H&P (Signed)
Pre-Procedure Colonoscopy Evaluation & History and Physical    Procedure: PR COLON CA SCRN NOT HI RSK IND [G0121] (COLORECTAL CANCER SCREENING, NOT HIGH RISK)    Complete for Surveillance Colonoscopy:    Last colonoscopy > or = 3 years ago   If no, indicate reason for repeat:  []$   Last colonoscopy incomplete  []$   Last colonoscopy had inadequate prep  []$   Last colonoscopy had > 10 adenomas  []$   Last colonoscopy had a large flat/sessile adenoma removed piecemeal         Diagnosis / Indication for procedure:     Average risk screening            Past Medical History:   Diagnosis Date    BPH (benign prostatic hyperplasia)     Hyperlipidemia     Hypertension        Physical Examination:    Mental Status:   [x]$   Normal   []$   Other:  Lungs:      [x]$   Normal   []$   Other:  Heart:      [x]$   Normal   []$   Other:  Abdomen:    [x]$   Normal   []$   Other        Sedation plan:      Monitor Anesthesia Care      Duard Larsen, MD  04/26/2022  8:03 AM
